# Patient Record
Sex: Male | Born: 1950
Health system: Southern US, Community
[De-identification: ages and names within clinical notes are randomized; demographics above are authoritative.]

## PROBLEM LIST (undated history)

## (undated) DIAGNOSIS — T7840XA Allergy, unspecified, initial encounter: Secondary | ICD-10-CM

## (undated) DIAGNOSIS — I10 Essential (primary) hypertension: Secondary | ICD-10-CM

## (undated) HISTORY — DX: Allergy, unspecified, initial encounter: T78.40XA

## (undated) HISTORY — DX: Essential (primary) hypertension: I10

---

## 1987-05-13 HISTORY — PX: CHOLECYSTECTOMY: SHX55

## 2019-05-17 ENCOUNTER — Encounter: Payer: Self-pay | Admitting: Family Medicine

## 2019-05-17 ENCOUNTER — Other Ambulatory Visit: Payer: Self-pay | Admitting: Family Medicine

## 2019-05-17 ENCOUNTER — Ambulatory Visit (INDEPENDENT_AMBULATORY_CARE_PROVIDER_SITE_OTHER): Payer: Medicare Other | Admitting: Family Medicine

## 2019-05-17 ENCOUNTER — Other Ambulatory Visit: Payer: Self-pay

## 2019-05-17 VITALS — BP 132/84 | HR 80 | Temp 98.3°F | Resp 18 | Ht 68.5 in | Wt 260.6 lb

## 2019-05-17 DIAGNOSIS — E669 Obesity, unspecified: Secondary | ICD-10-CM | POA: Diagnosis not present

## 2019-05-17 DIAGNOSIS — Z0000 Encounter for general adult medical examination without abnormal findings: Secondary | ICD-10-CM

## 2019-05-17 DIAGNOSIS — I10 Essential (primary) hypertension: Secondary | ICD-10-CM

## 2019-05-17 DIAGNOSIS — M19041 Primary osteoarthritis, right hand: Secondary | ICD-10-CM | POA: Diagnosis not present

## 2019-05-17 DIAGNOSIS — M19042 Primary osteoarthritis, left hand: Secondary | ICD-10-CM

## 2019-05-17 DIAGNOSIS — Z7689 Persons encountering health services in other specified circumstances: Secondary | ICD-10-CM | POA: Diagnosis not present

## 2019-05-17 DIAGNOSIS — R7309 Other abnormal glucose: Secondary | ICD-10-CM

## 2019-05-17 DIAGNOSIS — Z1159 Encounter for screening for other viral diseases: Secondary | ICD-10-CM

## 2019-05-17 DIAGNOSIS — R351 Nocturia: Secondary | ICD-10-CM

## 2019-05-17 MED ORDER — LOSARTAN POTASSIUM 100 MG PO TABS: 100.0000 mg | ORAL_TABLET | Freq: Every day | ORAL | 3 refills | Status: DC

## 2019-05-17 MED ORDER — AMLODIPINE BESYLATE 10 MG PO TABS: 10.0000 mg | ORAL_TABLET | Freq: Every day | ORAL | 3 refills | Status: DC

## 2019-05-17 NOTE — Assessment & Plan Note (Signed)
Encourage diet exercise weight loss 

## 2019-05-17 NOTE — Assessment & Plan Note (Signed)
Controlled - Home BP readings none  No known complications    Plan:  1. Continue current BP regimen - refilled Losartan 100mg  daily, Amlodipine 10mg  daily - local pharmacy he can DC previous provider rx 2. Encourage improved lifestyle - low sodium diet, regular exercise 3. May monitor BP outside office 4. Follow-up as planned several weeks for lab and physical

## 2019-05-17 NOTE — Progress Notes (Signed)
Subjective:    Patient ID: Cody Armstrong, male    DOB: [birth year], 68 y.o.   MRN: [id number]  Cody Armstrong is a 67 y.o. male presenting on 05/17/2019 for [place] and Hypertension  Relocated from [place] (near [place]), building house nearby here in [place].  HPI   General medical history He reports that he has been generally healthy - Back at Age 61 - he had an EKG as a screening. They referred him to a Cardiologist. They did an ECHOcardiogram - had a good report and normal result on ECHO. - No history of high cholesterol.  CHRONIC HTN: Reports no new concerns, continues on meds does well, needs transfer to local pharm Current Meds - Losartan 100mg  daily, Amlodipine 10mg  daily   Reports good compliance, took meds today. Tolerating well, w/o complaints. Lifestyle: - Diet: balanced, heavy on vegetables onions Denies CP, dyspnea, HA, edema, dizziness / lightheadedness  Osteoarthritis Multiple joints, hand/wrist/fingers Reports chronic history of repetitive overuse activity with hands. He has prior long history of working with hands and often tight grip, and says he has had wear and tear, previous dx arthritis, has used OTC treatments occasionally. Admits some nodules on hands, but no redness Admits stiffness and sore worse in AM  Chronic Allergic Rhinosinusitis Chronic problem. Seems worse recently Taking OTC Zyrtec generic. Never on nasal steroid. Denies fever or chills or purulence. Sinus pain.  Health Maintenance: UTD Pneumonia vaccines done age 89, 21. Last colonoscopy reported approx 6 years ago, around 2014. Request record.   Depression screen PHQ 2/9 05/17/2019  Decreased Interest 0  Down, Depressed, Hopeless 0  PHQ - 2 Score 0    Past Medical History:  Diagnosis Date  . Allergy   . Hypertension    Past Surgical History:  Procedure Laterality Date  . CHOLECYSTECTOMY  05/1987   Social History   Socioeconomic History  . Marital status: Married   Spouse name: Not on file  . Number of children: Not on file  . Years of education: [employer]  . Highest education level: High school graduate  Occupational History  . Not on file  Social Needs  . Financial resource strain: Not on file  . Food insecurity    Worry: Not on file    Inability: Not on file  . Transportation needs    Medical: Not on file    Non-medical: Not on file  Tobacco Use  . Smoking status: Former Smoker    Packs/day: 1.00    Years: 1.00    Pack years: 1.00  . Smokeless tobacco: Never Used  . Tobacco comment: only as a teen   Substance and Sexual Activity  . Alcohol use: Yes    Alcohol/week: 3.0 standard drinks    Types: 3 Standard drinks or equivalent per week  . Drug use: Never  . Sexual activity: Not on file  Lifestyle  . Physical activity    Days per week: Not on file    Minutes per session: Not on file  . Stress: Not on file  Relationships  . Social Herbalist on phone: Not on file    Gets together: Not on file    Attends religious service: Not on file    Active member of club or organization: Not on file    Attends meetings of clubs or organizations: Not on file    Relationship status: Not on file  . Intimate partner violence    Fear of current or ex partner:  Not on file    Emotionally abused: Not on file    Physically abused: Not on file    Forced sexual activity: Not on file  Other Topics Concern  . Not on file  Social History Narrative  . Not on file   Family History  Problem Relation Age of Onset  . Heart disease Mother   . Diabetes Mother   . Stroke Mother   . Stroke Father    Current Outpatient Medications on File Prior to Visit  Medication Sig  . fluticasone (FLONASE) 50 MCG/ACT nasal spray Place 2 sprays into both nostrils daily.  . naproxen (NAPROSYN) 250 MG tablet Take 250-500 mg by mouth 2 (two) times daily as needed.  . Omega-3 Fatty Acids (FISH OIL) 1000 MG CAPS Take by mouth.  . vitamin B-12 (CYANOCOBALAMIN)  100 MCG tablet Take 100 mcg by mouth daily.   No current facility-administered medications on file prior to visit.     Review of Systems Per HPI unless specifically indicated above     Objective:    BP 132/84 (BP Location: Left Arm, Patient Position: Sitting, Cuff Size: Normal)   Pulse 80   Temp 98.3 F (36.8 C) (Oral)   Resp 18   Ht 5' 8.5" (1.74 m)   Wt 260 lb 9.6 oz (118.2 kg)   SpO2 98%   BMI 39.05 kg/m   Wt Readings from Last 3 Encounters:  05/17/19 260 lb 9.6 oz (118.2 kg)    Physical Exam Vitals signs and nursing note reviewed.  Constitutional:      General: He is not in acute distress.    Appearance: He is well-developed. He is not diaphoretic.     Comments: Well-appearing, comfortable, cooperative  HENT:     Head: Normocephalic and atraumatic.  Eyes:     General:        Right eye: No discharge.        Left eye: No discharge.     Conjunctiva/sclera: Conjunctivae normal.  Cardiovascular:     Rate and Rhythm: Normal rate.  Pulmonary:     Effort: Pulmonary effort is normal.  Musculoskeletal:     Comments: Bilateral hands R>L with DIP nodules and some deformity angulation of distal joint. No erythema, no edema. Has full active ROM but slight stiffness. Some localized tender to R CMC joint. L hand similar but not as severe.  Skin:    General: Skin is warm and dry.     Findings: No erythema or rash.  Neurological:     Mental Status: He is alert and oriented to person, place, and time.  Psychiatric:        Behavior: Behavior normal.     Comments: Well groomed, good eye contact, normal speech and thoughts    No results found for this or any previous visit.    Assessment & Plan:   Problem List Items Addressed This Visit    Primary osteoarthritis of both hands    Clinically with bilateral R>L moderate advanced osteoarthritis with evidence on exam Secondary to chronic overuse wear and tear No injury No other complication, intact nerve / circulation  Plan  - Discussed progression of arthritis and treatments - Start more aggressive OTC regimen - Tylenol Ext Str 500 take 2 for 1000mg  TID PRN - Naproxen OTC 250mg  x 2 = 500mg  BID PRN - discussed avoid overuse NSAID limitations risk of side effect - Use topical OTC muscle rub or Voltaren if available - Compression, activity modification - Future  follow-up consider other meds, x-ray, referral possible CMC injection      Relevant Medications   naproxen (NAPROSYN) 250 MG tablet   Obesity (BMI 35.0-39.9 without comorbidity)    Encourage diet exercise weight loss      Essential hypertension - Primary    Controlled - Home BP readings none  No known complications    Plan:  1. Continue current BP regimen - refilled Losartan 100mg  daily, Amlodipine 10mg  daily - local pharmacy he can DC previous provider rx 2. Encourage improved lifestyle - low sodium diet, regular exercise 3. May monitor BP outside office 4. Follow-up as planned several weeks for lab and physical       Relevant Medications   amLODipine (NORVASC) 10 MG tablet   losartan (COZAAR) 100 MG tablet    Other Visit Diagnoses    Encounter to establish care with new doctor        Request copy of prior PCP outside records including test colonoscopy   Meds ordered this encounter  Medications  . amLODipine (NORVASC) 10 MG tablet    Sig: Take 1 tablet (10 mg total) by mouth daily.    Dispense:  90 tablet    Refill:  3  . losartan (COZAAR) 100 MG tablet    Sig: Take 1 tablet (100 mg total) by mouth daily.    Dispense:  90 tablet    Refill:  3    Follow up plan: Return in about 6 weeks (around 06/28/2019) for Annual Physical.  Future labs ordered for 06/21/19  Saralyn PilarAlexander Senaya Dicenso, DO [facility] 05/17/2019, 10:45 AM

## 2019-05-17 NOTE — Patient Instructions (Addendum)
Thank you for coming to the office today.  Get the OTC -Flonase (generic Fluticasone) - Start nasal steroid Flonase 2 sprays in each nostril daily for 4-6 weeks, may repeat course seasonally or as needed - can continue for few   Continue the OTC allergy pill  In future we can add a Singulair if needed.  Recommend trial of Anti-inflammatory with Naproxen 250mg  tabs - take one to two with food and plenty of water TWICE daily every day (breakfast and dinner), for next 2 to 4 weeks, then you may take only as needed - DO NOT TAKE any ibuprofen, motrin while you are taking this medicine  Recommend to start taking Tylenol Extra Strength 500mg  tabs - take 1 to 2 tabs per dose (max 1000mg ) every 6-8 hours for pain (take regularly, don't skip a dose for next 7 days), max 24 hour daily dose is 6 tablets or 3000mg . In the future you can repeat the same everyday Tylenol course for 1-2 weeks at a time.  - This is safe to take with anti-inflammatory medicines (Ibuprofen, Advil, Naproxen, Aleve, Meloxicam, Mobic)  May use Voltaren topical if needed.  Refilled the BP medications to local [facility], CANCEL the previous doctor rx.  DUE for FASTING BLOOD WORK (no food or drink after midnight before the lab appointment, only water or coffee without cream/sugar on the morning of)  SCHEDULE "Lab Only" visit in the morning at the clinic for lab draw in  4-6 weeks  - Make sure Lab Only appointment is at about 1 week before your next appointment, so that results will be available  For Lab Results, once available within 2-3 days of blood draw, you can can log in to MyChart online to view your results and a brief explanation. Also, we can discuss results at next follow-up visit.   Please schedule a Follow-up Appointment to: Return in about 6 weeks (around 06/28/2019) for Annual Physical.  If you have any other questions or concerns, please feel free to call the office or send a message through [facility]. You may also  schedule an earlier appointment if necessary.  Additionally, you may be receiving a survey about your experience at our office within a few days to 1 week by e-mail or mail. We value your feedback.  Nobie Putnam, DO [place]

## 2019-05-17 NOTE — Assessment & Plan Note (Signed)
Clinically with bilateral R>L moderate advanced osteoarthritis with evidence on exam Secondary to chronic overuse wear and tear No injury No other complication, intact nerve / circulation  Plan - Discussed progression of arthritis and treatments - Start more aggressive OTC regimen - Tylenol Ext Str 500 take 2 for 1000mg  TID PRN - Naproxen OTC 250mg  x 2 = 500mg  BID PRN - discussed avoid overuse NSAID limitations risk of side effect - Use topical OTC muscle rub or Voltaren if available - Compression, activity modification - Future follow-up consider other meds, x-ray, referral possible CMC injection

## 2019-06-21 ENCOUNTER — Other Ambulatory Visit: Payer: Medicare Other

## 2019-06-21 ENCOUNTER — Other Ambulatory Visit: Payer: Self-pay

## 2019-06-21 DIAGNOSIS — I10 Essential (primary) hypertension: Secondary | ICD-10-CM

## 2019-06-21 DIAGNOSIS — Z0000 Encounter for general adult medical examination without abnormal findings: Secondary | ICD-10-CM

## 2019-06-21 DIAGNOSIS — Z1159 Encounter for screening for other viral diseases: Secondary | ICD-10-CM

## 2019-06-21 DIAGNOSIS — R351 Nocturia: Secondary | ICD-10-CM

## 2019-06-21 DIAGNOSIS — R7309 Other abnormal glucose: Secondary | ICD-10-CM | POA: Diagnosis not present

## 2019-06-22 LAB — CBC WITH DIFFERENTIAL/PLATELET
Absolute Monocytes: 531 cells/uL (ref 200–950)
Basophils Absolute: 67 cells/uL (ref 0–200)
Basophils Relative: 1.1 %
Eosinophils Absolute: 311 cells/uL (ref 15–500)
Eosinophils Relative: 5.1 %
HCT: 48.4 % (ref 38.5–50.0)
Hemoglobin: 16.4 g/dL (ref 13.2–17.1)
Lymphs Abs: 2050 cells/uL (ref 850–3900)
MCH: 29.6 pg (ref 27.0–33.0)
MCHC: 33.9 g/dL (ref 32.0–36.0)
MCV: 87.4 fL (ref 80.0–100.0)
MPV: 9.8 fL (ref 7.5–12.5)
Monocytes Relative: 8.7 %
Neutro Abs: 3142 cells/uL (ref 1500–7800)
Neutrophils Relative %: 51.5 %
Platelets: 248 10*3/uL (ref 140–400)
RBC: 5.54 10*6/uL (ref 4.20–5.80)
RDW: 13 % (ref 11.0–15.0)
Total Lymphocyte: 33.6 %
WBC: 6.1 10*3/uL (ref 3.8–10.8)

## 2019-06-22 LAB — TSH: TSH: 1.74 mIU/L (ref 0.40–4.50)

## 2019-06-22 LAB — HEPATITIS C ANTIBODY
Hepatitis C Ab: NONREACTIVE
SIGNAL TO CUT-OFF: 0.01 (ref <1.00)

## 2019-06-22 LAB — COMPLETE METABOLIC PANEL WITH GFR
AG Ratio: 2.4 (calc) (ref 1.0–2.5)
ALT: 64 U/L — ABNORMAL HIGH (ref 9–46)
AST: 31 U/L (ref 10–35)
Albumin: 4.6 g/dL (ref 3.6–5.1)
Alkaline phosphatase (APISO): 65 U/L (ref 35–144)
BUN: 19 mg/dL (ref 7–25)
CO2: 24 mmol/L (ref 20–32)
Calcium: 9.6 mg/dL (ref 8.6–10.3)
Chloride: 106 mmol/L (ref 98–110)
Creat: 0.95 mg/dL (ref 0.70–1.25)
GFR, Est African American: 95 mL/min/{1.73_m2} (ref >=60)
GFR, Est Non African American: 82 mL/min/{1.73_m2} (ref >=60)
Globulin: 1.9 g/dL (calc) (ref 1.9–3.7)
Glucose, Bld: 115 mg/dL — ABNORMAL HIGH (ref 65–99)
Potassium: 4.5 mmol/L (ref 3.5–5.3)
Sodium: 140 mmol/L (ref 135–146)
Total Bilirubin: 0.4 mg/dL (ref 0.2–1.2)
Total Protein: 6.5 g/dL (ref 6.1–8.1)

## 2019-06-22 LAB — HEMOGLOBIN A1C
Hgb A1c MFr Bld: 5.7 % of total Hgb — ABNORMAL HIGH (ref <5.7)
Mean Plasma Glucose: 117 (calc)
eAG (mmol/L): 6.5 (calc)

## 2019-06-22 LAB — LIPID PANEL
Cholesterol: 196 mg/dL (ref <200)
HDL: 46 mg/dL (ref >=40)
LDL Cholesterol (Calc): 111 mg/dL (calc) — ABNORMAL HIGH
Non-HDL Cholesterol (Calc): 150 mg/dL (calc) — ABNORMAL HIGH (ref <130)
Total CHOL/HDL Ratio: 4.3 (calc) (ref <5.0)
Triglycerides: 295 mg/dL — ABNORMAL HIGH (ref <150)

## 2019-06-22 LAB — PSA: PSA: 0.7 ng/mL (ref <=4.0)

## 2019-06-27 ENCOUNTER — Other Ambulatory Visit: Payer: Self-pay | Admitting: Family Medicine

## 2019-06-27 ENCOUNTER — Encounter: Payer: Self-pay | Admitting: Family Medicine

## 2019-06-27 ENCOUNTER — Ambulatory Visit (INDEPENDENT_AMBULATORY_CARE_PROVIDER_SITE_OTHER): Payer: Medicare Other | Admitting: Family Medicine

## 2019-06-27 ENCOUNTER — Other Ambulatory Visit: Payer: Self-pay

## 2019-06-27 VITALS — BP 126/64 | HR 85 | Temp 98.0°F | Resp 18 | Ht 68.5 in | Wt 262.0 lb

## 2019-06-27 DIAGNOSIS — R7309 Other abnormal glucose: Secondary | ICD-10-CM

## 2019-06-27 DIAGNOSIS — I10 Essential (primary) hypertension: Secondary | ICD-10-CM | POA: Diagnosis not present

## 2019-06-27 DIAGNOSIS — E782 Mixed hyperlipidemia: Secondary | ICD-10-CM | POA: Diagnosis not present

## 2019-06-27 DIAGNOSIS — Z0000 Encounter for general adult medical examination without abnormal findings: Secondary | ICD-10-CM

## 2019-06-27 DIAGNOSIS — E669 Obesity, unspecified: Secondary | ICD-10-CM | POA: Diagnosis not present

## 2019-06-27 DIAGNOSIS — Z125 Encounter for screening for malignant neoplasm of prostate: Secondary | ICD-10-CM

## 2019-06-27 DIAGNOSIS — R351 Nocturia: Secondary | ICD-10-CM

## 2019-06-27 NOTE — Assessment & Plan Note (Signed)
Mostly controlled cholesterol LDL HDL on lifestyle, has elevated TG now w/ higher sugar Last lipid panel 06/2019 Calculated ASCVD 10 yr risk score 20%  Plan: 1. Offered ASCVD risk reduction primary with Statin therapy - he declined, despite counseling benefit risk 2. Encourage improved lifestyle - low carb/cholesterol, reduce portion size, continue improving regular exercise Yearly lipid

## 2019-06-27 NOTE — Progress Notes (Signed)
Subjective:    Patient ID: Cody Armstrong, male    DOB: 07-Sep-1950, 68 y.o.   MRN: [id number]  Cody Armstrong is a 68 y.o. male presenting on 06/27/2019 for Annual Exam   HPI   Here for Annual Physical and Lab Review.  CHRONIC HTN: Reports no new concerns, continues on meds does well Current Meds - Losartan 100mg  daily, Amlodipine 10mg  daily   Reports good compliance, took meds today. Tolerating well, w/o complaints. Lifestyle: - Diet: balanced, heavy on vegetables onions Denies CP, dyspnea, HA, edema, dizziness / lightheadedness  Osteoarthritis Multiple joints, hand/wrist/fingers Reports chronic history of repetitive overuse activity with hands. He has prior long history of working with hands and often tight grip, and says he has had wear and tear, previous dx arthritis, has used OTC treatments occasionally.  He takes Tylenol / Naproxen PRN with some good results Admits some nodules on hands, but no redness Admits stiffness and sore worse in AM  HYPERLIPIDEMIA: - Reports no concerns. Last lipid panel 06/2019, controlled LDL HDL except high TG Not on statin therapy  Elevated ALT Prior history elevated ALT isolated, chart review results for past 5 years, similar readings. No new concerns.   Health Maintenance: UTD Pneumonia vaccines done age 8, 89. Last colonoscopy reported approx 6 years ago, around 2014. Request record. Again - offered cologuard.  Prostate CA Screening: Prior PSA / DRE reported normal. Last PSA 0.7 (06/2019). Currently asymptomatic. No known family history of prostate CA.    Depression screen PHQ 2/9 05/17/2019  Decreased Interest 0  Down, Depressed, Hopeless 0  PHQ - 2 Score 0    Past Medical History:  Diagnosis Date  . Allergy   . Hypertension    Past Surgical History:  Procedure Laterality Date  . CHOLECYSTECTOMY  05/1987   Social History   Socioeconomic History  . Marital status: Married    Spouse name: Not on file  . Number of  children: Not on file  . Years of education: [employer]  . Highest education level: High school graduate  Occupational History  . Not on file  Tobacco Use  . Smoking status: Former Smoker    Packs/day: 1.00    Years: 1.00    Pack years: 1.00  . Smokeless tobacco: Never Used  . Tobacco comment: only as a teen   Substance and Sexual Activity  . Alcohol use: Yes    Alcohol/week: 3.0 standard drinks    Types: 3 Standard drinks or equivalent per week  . Drug use: Never  . Sexual activity: Not on file  Other Topics Concern  . Not on file  Social History Narrative  . Not on file   Social Determinants of Health   Financial Resource Strain:   . Difficulty of Paying Living Expenses: Not on file  Food Insecurity:   . Worried About Charity fundraiser in the Last Year: Not on file  . Ran Out of Food in the Last Year: Not on file  Transportation Needs:   . Lack of Transportation (Medical): Not on file  . Lack of Transportation (Non-Medical): Not on file  Physical Activity:   . Days of Exercise per Week: Not on file  . Minutes of Exercise per Session: Not on file  Stress:   . Feeling of Stress : Not on file  Social Connections:   . Frequency of Communication with Friends and Family: Not on file  . Frequency of Social Gatherings with Friends and Family: Not on file  .  Attends Religious Services: Not on file  . Active Member of Clubs or Organizations: Not on file  . Attends BankerClub or Organization Meetings: Not on file  . Marital Status: Not on file  Intimate Partner Violence:   . Fear of Current or Ex-Partner: Not on file  . Emotionally Abused: Not on file  . Physically Abused: Not on file  . Sexually Abused: Not on file   Family History  Problem Relation Age of Onset  . Heart disease Mother   . Diabetes Mother   . Stroke Mother   . Stroke Father    Current Outpatient Medications on File Prior to Visit  Medication Sig  . amLODipine (NORVASC) 10 MG tablet Take 1 tablet (10  mg total) by mouth daily.  . fluticasone (FLONASE) 50 MCG/ACT nasal spray Place 2 sprays into both nostrils daily.  Marland Kitchen. losartan (COZAAR) 100 MG tablet Take 1 tablet (100 mg total) by mouth daily.  . naproxen (NAPROSYN) 250 MG tablet Take 250-500 mg by mouth 2 (two) times daily as needed.  . Omega-3 Fatty Acids (FISH OIL) 1000 MG CAPS Take by mouth.  . vitamin B-12 (CYANOCOBALAMIN) 100 MCG tablet Take 100 mcg by mouth daily.   No current facility-administered medications on file prior to visit.    Review of Systems  Constitutional: Negative for activity change, appetite change, chills, diaphoresis, fatigue and fever.  HENT: Negative for congestion and hearing loss.   Eyes: Negative for visual disturbance.  Respiratory: Negative for apnea, cough, chest tightness, shortness of breath and wheezing.   Cardiovascular: Negative for chest pain, palpitations and leg swelling.  Gastrointestinal: Negative for abdominal pain, anal bleeding, blood in stool, constipation, diarrhea, nausea and vomiting.  Endocrine: Negative for cold intolerance.  Genitourinary: Negative for difficulty urinating, dysuria, frequency and hematuria.  Musculoskeletal: Negative for arthralgias, back pain and neck pain.  Skin: Negative for rash.  Allergic/Immunologic: Negative for environmental allergies.  Neurological: Negative for dizziness, weakness, light-headedness, numbness and headaches.  Hematological: Negative for adenopathy.  Psychiatric/Behavioral: Negative for behavioral problems, dysphoric mood and sleep disturbance. The patient is not nervous/anxious.    Per HPI unless specifically indicated above      Objective:    BP 126/64 (BP Location: Left Arm, Cuff Size: Normal)   Pulse 85   Temp 98 F (36.7 C) (Oral)   Resp 18   Ht 5' 8.5" (1.74 m)   Wt 262 lb (118.8 kg)   BMI 39.26 kg/m   Wt Readings from Last 3 Encounters:  06/27/19 262 lb (118.8 kg)  05/17/19 260 lb 9.6 oz (118.2 kg)    Physical  Exam Vitals and nursing note reviewed.  Constitutional:      General: He is not in acute distress.    Appearance: He is well-developed. He is not diaphoretic.     Comments: Well-appearing, comfortable, cooperative  HENT:     Head: Normocephalic and atraumatic.  Eyes:     General:        Right eye: No discharge.        Left eye: No discharge.     Conjunctiva/sclera: Conjunctivae normal.     Pupils: Pupils are equal, round, and reactive to light.  Neck:     Thyroid: No thyromegaly.     Comments: No carotid bruits Cardiovascular:     Rate and Rhythm: Normal rate and regular rhythm.     Heart sounds: Normal heart sounds. No murmur.  Pulmonary:     Effort: Pulmonary effort is normal. No  respiratory distress.     Breath sounds: Normal breath sounds. No wheezing or rales.  Abdominal:     General: Bowel sounds are normal. There is no distension.     Palpations: Abdomen is soft. There is no mass.     Tenderness: There is no abdominal tenderness.  Musculoskeletal:        General: No tenderness. Normal range of motion.     Cervical back: Normal range of motion and neck supple.     Comments: Upper / Lower Extremities: - Normal muscle tone, strength bilateral upper extremities 5/5, lower extremities 5/5  Lymphadenopathy:     Cervical: No cervical adenopathy.  Skin:    General: Skin is warm and dry.     Findings: No erythema or rash.  Neurological:     Mental Status: He is alert and oriented to person, place, and time.     Comments: Distal sensation intact to light touch all extremities  Psychiatric:        Behavior: Behavior normal.     Comments: Well groomed, good eye contact, normal speech and thoughts         Results for orders placed or performed in visit on 06/21/19  [facility] - TSH  Result Value Ref Range   TSH 1.74 0.40 - 4.50 mIU/L  [facility] - Hepatitis C antibody, Hep C Screen w/ reflex RNA quant  Result Value Ref Range   Hepatitis C Ab NON-REACTIVE NON-REACTI   SIGNAL TO  CUT-OFF 0.01 <1.00  [facility] - PSA physical  Result Value Ref Range   PSA 0.7 < OR = 4.0 ng/mL  [facility] - Lipid panel physical  Result Value Ref Range   Cholesterol 196 <200 mg/dL   HDL 46 > OR = 40 mg/dL   Triglycerides 725 (H) <150 mg/dL   LDL Cholesterol (Calc) 111 (H) mg/dL (calc)   Total CHOL/HDL Ratio 4.3 <5.0 (calc)   Non-HDL Cholesterol (Calc) 150 (H) <130 mg/dL (calc)  [facility] - CMET w/ GFR CMP Complete Metabolic Panel physical  Result Value Ref Range   Glucose, Bld 115 (H) 65 - 99 mg/dL   BUN 19 7 - 25 mg/dL   Creat 3.66 4.40 - 3.47 mg/dL   GFR, Est Non African American 82 > OR = 60 mL/min/1.41m2   GFR, Est African American 95 > OR = 60 mL/min/1.52m2   BUN/Creatinine Ratio NOT APPLICABLE 6 - 22 (calc)   Sodium 140 135 - 146 mmol/L   Potassium 4.5 3.5 - 5.3 mmol/L   Chloride 106 98 - 110 mmol/L   CO2 24 20 - 32 mmol/L   Calcium 9.6 8.6 - 10.3 mg/dL   Total Protein 6.5 6.1 - 8.1 g/dL   Albumin 4.6 3.6 - 5.1 g/dL   Globulin 1.9 1.9 - 3.7 g/dL (calc)   AG Ratio 2.4 1.0 - 2.5 (calc)   Total Bilirubin 0.4 0.2 - 1.2 mg/dL   Alkaline phosphatase (APISO) 65 35 - 144 U/L   AST 31 10 - 35 U/L   ALT 64 (H) 9 - 46 U/L  [facility] - CBC with Differential/Platelet physical  Result Value Ref Range   WBC 6.1 3.8 - 10.8 Thousand/uL   RBC 5.54 4.20 - 5.80 Million/uL   Hemoglobin 16.4 13.2 - 17.1 g/dL   HCT 42.5 95.6 - 38.7 %   MCV 87.4 80.0 - 100.0 fL   MCH 29.6 27.0 - 33.0 pg   MCHC 33.9 32.0 - 36.0 g/dL   RDW 56.4 33.2 - 95.1 %  Platelets 248 140 - 400 Thousand/uL   MPV 9.8 7.5 - 12.5 fL   Neutro Abs 3,142 1,500 - 7,800 cells/uL   Lymphs Abs 2,050 850 - 3,900 cells/uL   Absolute Monocytes 531 200 - 950 cells/uL   Eosinophils Absolute 311 15 - 500 cells/uL   Basophils Absolute 67 0 - 200 cells/uL   Neutrophils Relative % 51.5 %   Total Lymphocyte 33.6 %   Monocytes Relative 8.7 %   Eosinophils Relative 5.1 %   Basophils Relative 1.1 %  [facility] - A1c LAB Hemoglobin A1C physical  Result  Value Ref Range   Hgb A1c MFr Bld 5.7 (H) <5.7 % of total Hgb   Mean Plasma Glucose 117 (calc)   eAG (mmol/L) 6.5 (calc)      Assessment & Plan:   Problem List Items Addressed This Visit    Obesity (BMI 35.0-39.9 without comorbidity)    Encourage diet exercise weight loss      Mixed hyperlipidemia    Mostly controlled cholesterol LDL HDL on lifestyle, has elevated TG now w/ higher sugar Last lipid panel 06/2019 Calculated ASCVD 10 yr risk score 20%  Plan: 1. Offered ASCVD risk reduction primary with Statin therapy - he declined, despite counseling benefit risk 2. Encourage improved lifestyle - low carb/cholesterol, reduce portion size, continue improving regular exercise Yearly lipid      Essential hypertension    Controlled - Home BP readings none  No known complications    Plan:  1. Continue current BP regimen - Losartan 100mg  daily, Amlodipine 10mg  daily 2. Encourage improved lifestyle - low sodium diet, regular exercise 3. May monitor BP outside office      Elevated hemoglobin A1c    Mild elevated A1c to 5.7, at range of early PreDM now Concern with obesity, HTN, HLD  Plan:  1. Not on any therapy currently  2. Encourage improved lifestyle - low carb, low sugar diet, reduce portion size, continue improving regular exercise - diet handout given 3. Follow-up 6 to 12 mo repeat A1c        Other Visit Diagnoses    Annual physical exam    -  Primary     Updated Health Maintenance information - Offered Cologuard, will request prior record, still didn't receive it for colonoscopy, should be due in 2022 or 2024, he will consider cologuard. Reviewed recent lab results with patient Encouraged improvement to lifestyle with diet and exercise - Goal of weight loss   No orders of the defined types were placed in this encounter.   Follow up plan: Return in about 1 year (around 06/26/2020) for Annual Physical.  2025, DO [facility] 06/27/2019, 10:25 AM

## 2019-06-27 NOTE — Assessment & Plan Note (Signed)
Mild elevated A1c to 5.7, at range of early PreDM now Concern with obesity, HTN, HLD  Plan:  1. Not on any therapy currently  2. Encourage improved lifestyle - low carb, low sugar diet, reduce portion size, continue improving regular exercise - diet handout given 3. Follow-up 6 to 12 mo repeat A1c

## 2019-06-27 NOTE — Assessment & Plan Note (Signed)
Controlled - Home BP readings none  No known complications    Plan:  1. Continue current BP regimen - Losartan 100mg  daily, Amlodipine 10mg  daily 2. Encourage improved lifestyle - low sodium diet, regular exercise 3. May monitor BP outside office

## 2019-06-27 NOTE — Assessment & Plan Note (Signed)
Encourage diet exercise weight loss 

## 2019-06-27 NOTE — Patient Instructions (Addendum)
Thank you for coming to the office today.  Keep up the good work.  Reduce carbs / starches bread pasta to help A1c sugar, and Triglyerides.   DUE for FASTING BLOOD WORK (no food or drink after midnight before the lab appointment, only water or coffee without cream/sugar on the morning of)  SCHEDULE "Lab Only" visit in the morning at the clinic for lab draw in 1 YEAR  - Make sure Lab Only appointment is at about 1 week before your next appointment, so that results will be available  For Lab Results, once available within 2-3 days of blood draw, you can can log in to MyChart online to view your results and a brief explanation. Also, we can discuss results at next follow-up visit.   Please schedule a Follow-up Appointment to: Return in about 1 year (around 06/26/2020) for Annual Physical.  If you have any other questions or concerns, please feel free to call the office or send a message through [place]. You may also schedule an earlier appointment if necessary.  Additionally, you may be receiving a survey about your experience at our office within a few days to 1 week by e-mail or mail. We value your feedback.  Nobie Putnam, DO [place]

## 2019-06-28 ENCOUNTER — Encounter: Payer: Medicare Other | Admitting: Family Medicine

## 2020-02-05 ENCOUNTER — Encounter: Payer: Self-pay | Admitting: Family Medicine

## 2020-06-20 ENCOUNTER — Other Ambulatory Visit: Payer: Self-pay | Admitting: *Deleted

## 2020-06-20 DIAGNOSIS — Z0000 Encounter for general adult medical examination without abnormal findings: Secondary | ICD-10-CM

## 2020-06-20 DIAGNOSIS — R351 Nocturia: Secondary | ICD-10-CM

## 2020-06-20 DIAGNOSIS — E669 Obesity, unspecified: Secondary | ICD-10-CM

## 2020-06-20 DIAGNOSIS — R7309 Other abnormal glucose: Secondary | ICD-10-CM

## 2020-06-20 DIAGNOSIS — E782 Mixed hyperlipidemia: Secondary | ICD-10-CM

## 2020-06-20 DIAGNOSIS — I10 Essential (primary) hypertension: Secondary | ICD-10-CM

## 2020-06-20 DIAGNOSIS — Z125 Encounter for screening for malignant neoplasm of prostate: Secondary | ICD-10-CM

## 2020-06-23 ENCOUNTER — Other Ambulatory Visit: Payer: Medicare Other

## 2020-06-23 DIAGNOSIS — E782 Mixed hyperlipidemia: Secondary | ICD-10-CM | POA: Diagnosis not present

## 2020-06-23 DIAGNOSIS — R7309 Other abnormal glucose: Secondary | ICD-10-CM | POA: Diagnosis not present

## 2020-06-23 DIAGNOSIS — I10 Essential (primary) hypertension: Secondary | ICD-10-CM | POA: Diagnosis not present

## 2020-06-24 LAB — CBC WITH DIFFERENTIAL/PLATELET
Absolute Monocytes: 506 cells/uL (ref 200–950)
Basophils Absolute: 58 cells/uL (ref 0–200)
Basophils Relative: 0.9 %
Eosinophils Absolute: 352 cells/uL (ref 15–500)
Eosinophils Relative: 5.5 %
HCT: 49.1 % (ref 38.5–50.0)
Hemoglobin: 16.8 g/dL (ref 13.2–17.1)
Lymphs Abs: 1933 cells/uL (ref 850–3900)
MCH: 29.7 pg (ref 27.0–33.0)
MCHC: 34.2 g/dL (ref 32.0–36.0)
MCV: 86.9 fL (ref 80.0–100.0)
MPV: 9.8 fL (ref 7.5–12.5)
Monocytes Relative: 7.9 %
Neutro Abs: 3552 cells/uL (ref 1500–7800)
Neutrophils Relative %: 55.5 %
Platelets: 262 10*3/uL (ref 140–400)
RBC: 5.65 10*6/uL (ref 4.20–5.80)
RDW: 12.9 % (ref 11.0–15.0)
Total Lymphocyte: 30.2 %
WBC: 6.4 10*3/uL (ref 3.8–10.8)

## 2020-06-24 LAB — COMPLETE METABOLIC PANEL WITH GFR
AG Ratio: 1.8 (calc) (ref 1.0–2.5)
ALT: 77 U/L — ABNORMAL HIGH (ref 9–46)
AST: 33 U/L (ref 10–35)
Albumin: 4.6 g/dL (ref 3.6–5.1)
Alkaline phosphatase (APISO): 67 U/L (ref 35–144)
BUN: 18 mg/dL (ref 7–25)
CO2: 26 mmol/L (ref 20–32)
Calcium: 10 mg/dL (ref 8.6–10.3)
Chloride: 105 mmol/L (ref 98–110)
Creat: 0.93 mg/dL (ref 0.70–1.25)
GFR, Est African American: 97 mL/min/{1.73_m2} (ref >=60)
GFR, Est Non African American: 83 mL/min/{1.73_m2} (ref >=60)
Globulin: 2.5 g/dL (calc) (ref 1.9–3.7)
Glucose, Bld: 114 mg/dL — ABNORMAL HIGH (ref 65–99)
Potassium: 4.6 mmol/L (ref 3.5–5.3)
Sodium: 141 mmol/L (ref 135–146)
Total Bilirubin: 0.6 mg/dL (ref 0.2–1.2)
Total Protein: 7.1 g/dL (ref 6.1–8.1)

## 2020-06-24 LAB — LIPID PANEL
Cholesterol: 214 mg/dL — ABNORMAL HIGH (ref <200)
HDL: 49 mg/dL (ref >=40)
LDL Cholesterol (Calc): 125 mg/dL (calc) — ABNORMAL HIGH
Non-HDL Cholesterol (Calc): 165 mg/dL (calc) — ABNORMAL HIGH (ref <130)
Total CHOL/HDL Ratio: 4.4 (calc) (ref <5.0)
Triglycerides: 259 mg/dL — ABNORMAL HIGH (ref <150)

## 2020-06-24 LAB — HEMOGLOBIN A1C
Hgb A1c MFr Bld: 5.7 % of total Hgb — ABNORMAL HIGH (ref <5.7)
Mean Plasma Glucose: 117 mg/dL
eAG (mmol/L): 6.5 mmol/L

## 2020-06-24 LAB — PSA: PSA: 1.41 ng/mL (ref <=4.0)

## 2020-06-30 ENCOUNTER — Encounter: Payer: Self-pay | Admitting: Family Medicine

## 2020-06-30 ENCOUNTER — Other Ambulatory Visit: Payer: Self-pay | Admitting: Family Medicine

## 2020-06-30 ENCOUNTER — Other Ambulatory Visit: Payer: Self-pay

## 2020-06-30 ENCOUNTER — Ambulatory Visit (INDEPENDENT_AMBULATORY_CARE_PROVIDER_SITE_OTHER): Payer: Medicare Other | Admitting: Family Medicine

## 2020-06-30 VITALS — BP 134/79 | HR 80 | Temp 97.5°F | Resp 16 | Ht 68.0 in | Wt 257.6 lb

## 2020-06-30 DIAGNOSIS — I10 Essential (primary) hypertension: Secondary | ICD-10-CM | POA: Diagnosis not present

## 2020-06-30 DIAGNOSIS — R7309 Other abnormal glucose: Secondary | ICD-10-CM

## 2020-06-30 DIAGNOSIS — E782 Mixed hyperlipidemia: Secondary | ICD-10-CM

## 2020-06-30 DIAGNOSIS — M19041 Primary osteoarthritis, right hand: Secondary | ICD-10-CM | POA: Diagnosis not present

## 2020-06-30 DIAGNOSIS — E669 Obesity, unspecified: Secondary | ICD-10-CM | POA: Diagnosis not present

## 2020-06-30 DIAGNOSIS — Z0000 Encounter for general adult medical examination without abnormal findings: Secondary | ICD-10-CM

## 2020-06-30 DIAGNOSIS — M19042 Primary osteoarthritis, left hand: Secondary | ICD-10-CM

## 2020-06-30 DIAGNOSIS — R351 Nocturia: Secondary | ICD-10-CM

## 2020-06-30 MED ORDER — LOSARTAN POTASSIUM 100 MG PO TABS: 100.0000 mg | ORAL_TABLET | Freq: Every day | ORAL | 3 refills | Status: DC

## 2020-06-30 MED ORDER — GABAPENTIN 100 MG PO CAPS: ORAL_CAPSULE | ORAL | 3 refills | Status: DC

## 2020-06-30 MED ORDER — AMLODIPINE BESYLATE 10 MG PO TABS: 10.0000 mg | ORAL_TABLET | Freq: Every day | ORAL | 3 refills | Status: DC

## 2020-06-30 NOTE — Assessment & Plan Note (Signed)
Mild elevated A1c to 5.7, at range of early PreDM now Concern with obesity, HTN, HLD  Plan:  1. Not on any therapy currently  2. Encourage improved lifestyle - low carb, low sugar diet, reduce portion size, continue improving regular exercise - diet handout given Yearly check

## 2020-06-30 NOTE — Assessment & Plan Note (Signed)
Clinically with bilateral R>L moderate advanced osteoarthritis with evidence on exam Secondary to chronic overuse wear and tear No injury No other complication, intact nerve / circulation  Plan - Discussed progression of arthritis and treatments - Keep on regimen - Tylenol Ext Str 500 take 2 for 1000mg  TID PRN - He may use Ibuprofen PO PRN as discussed - Re-Try Voltaren topical OTC - START Gabapentin 100mg  titration as advised - Compression, activity modification - Future follow-up consider other meds, x-ray, referral possible [facility] injection

## 2020-06-30 NOTE — Progress Notes (Signed)
Subjective:    Patient ID: Cody Armstrong, male    DOB: March 29, 1951, 68 y.o.   MRN: [id number]  Cody Armstrong is a 69 y.o. male presenting on 06/30/2020 for Annual Exam   HPI  Here for Annual Physical and Lab Review.  CHRONIC HTN: Reportsno new concerns, continues on meds does well Current Meds -Losartan 100mg  daily, Amlodipine 10mg  daily Reports good compliance, took meds today. Tolerating well, w/o complaints. Lifestyle: - Diet:balanced, heavy on vegetables onions Denies CP, dyspnea, HA, edema, dizziness / lightheadedness  Osteoarthritis Multiple joints, hand/wrist/fingers Reports chronic history of repetitive overuse activity with hands. Cody Armstrong has prior long history of working with hands and often tight grip, and says Cody Armstrong has had wear and tear, previous dx arthritis, has used OTC treatments occasionally.  Cody Armstrong takes Tylenol / Ibuprofen PRN with some good results Admits some nodules on hands, but no redness Admits stiffness and sore worse in AM Worse with winter weather. Plays golf weekly Has not tried Gabapentin.  HYPERLIPIDEMIA: - Reports no concerns. Last lipid panel 06/2020, controlled LDL HDL except high TG Not on statin therapy. Cody Armstrong declines Cody Armstrong tries to avoid some meats, beef/pork Prior history of Cardiology evaluation and had EKG / stress test, ruled out cardiac blockages.  Elevated ALT Prior history elevated ALT isolated   Health Maintenance: UTD Pneumonia vaccines done age 23, 64. Last colonoscopy reported approx 6 years ago, around 2014. Request record. Again - offered cologuard.  Prostate CA Screening: Prior PSA / DRE reported normal. Last PSA 0.7 (06/2019). Currently asymptomatic. No known family history of prostate CA.  Depression screen [facility] 2/9 06/30/2020 05/17/2019  Decreased Interest 0 0  Down, Depressed, Hopeless 0 0  PHQ - 2 Score 0 0    Past Medical History:  Diagnosis Date  . Allergy   . Hypertension    Past Surgical History:   Procedure Laterality Date  . CHOLECYSTECTOMY  05/1987   Social History   Socioeconomic History  . Marital status: Married    Spouse name: Not on file  . Number of children: Not on file  . Years of education: 13/11/2018  . Highest education level: High school graduate  Occupational History  . Not on file  Tobacco Use  . Smoking status: Former Smoker    Packs/day: 1.00    Years: 1.00    Pack years: 1.00  . Smokeless tobacco: Never Used  . Tobacco comment: only as a teen   Vaping Use  . Vaping Use: Never used  Substance and Sexual Activity  . Alcohol use: Yes    Alcohol/week: 3.0 standard drinks    Types: 3 Standard drinks or equivalent per week  . Drug use: Never  . Sexual activity: Not on file  Other Topics Concern  . Not on file  Social History Narrative  . Not on file   Social Determinants of Health   Financial Resource Strain: Not on file  Food Insecurity: Not on file  Transportation Needs: Not on file  Physical Activity: Not on file  Stress: Not on file  Social Connections: Not on file  Intimate Partner Violence: Not on file   Family History  Problem Relation Age of Onset  . Heart disease Mother   . Diabetes Mother   . Stroke Mother   . Stroke Father    Current Outpatient Medications on File Prior to Visit  Medication Sig  . Omega-3 Fatty Acids (FISH OIL) 1000 MG CAPS Take by mouth. (Patient not taking: Reported on 06/30/2020)  .  vitamin B-12 (CYANOCOBALAMIN) 100 MCG tablet Take 100 mcg by mouth daily. (Patient not taking: Reported on 06/30/2020)   No current facility-administered medications on file prior to visit.    Review of Systems  Constitutional: Negative for activity change, appetite change, chills, diaphoresis, fatigue and fever.  HENT: Negative for congestion and hearing loss.   Eyes: Negative for visual disturbance.  Respiratory: Negative for cough, chest tightness, shortness of breath and wheezing.   Cardiovascular: Negative for chest  pain, palpitations and leg swelling.  Gastrointestinal: Negative for abdominal pain, constipation, diarrhea, nausea and vomiting.  Endocrine: Negative for cold intolerance.  Genitourinary: Negative for difficulty urinating, dysuria, frequency and hematuria.  Musculoskeletal: Negative for arthralgias and neck pain.  Skin: Negative for rash.  Allergic/Immunologic: Negative for environmental allergies.  Neurological: Negative for dizziness, weakness, light-headedness, numbness and headaches.  Hematological: Negative for adenopathy.  Psychiatric/Behavioral: Negative for behavioral problems, dysphoric mood and sleep disturbance.   Per HPI unless specifically indicated above      Objective:    BP 134/79 (BP Location: Left Arm, Cuff Size: Normal)   Pulse 80   Temp (!) 97.5 F (36.4 C) (Temporal)   Resp 16   Ht 5\' 8"  (1.727 m)   Wt 257 lb 9.6 oz (116.8 kg)   SpO2 97%   BMI 39.17 kg/m   Wt Readings from Last 3 Encounters:  06/30/20 257 lb 9.6 oz (116.8 kg)  06/27/19 262 lb (118.8 kg)  05/17/19 260 lb 9.6 oz (118.2 kg)    Physical Exam Vitals and nursing note reviewed.  Constitutional:      General: Cody Armstrong is not in acute distress.    Appearance: Cody Armstrong is well-developed and well-nourished. Cody Armstrong is not diaphoretic.     Comments: Well-appearing, comfortable, cooperative  HENT:     Head: Normocephalic and atraumatic.     Mouth/Throat:     Mouth: Oropharynx is clear and moist.  Eyes:     General:        Right eye: No discharge.        Left eye: No discharge.     Extraocular Movements: EOM normal.     Conjunctiva/sclera: Conjunctivae normal.     Pupils: Pupils are equal, round, and reactive to light.  Neck:     Thyroid: No thyromegaly.     Vascular: No carotid bruit.  Cardiovascular:     Rate and Rhythm: Normal rate and regular rhythm.     Pulses: Intact distal pulses.     Heart sounds: Normal heart sounds. No murmur heard.   Pulmonary:     Effort: Pulmonary effort is normal. No  respiratory distress.     Breath sounds: Normal breath sounds. No wheezing or rales.  Abdominal:     General: Bowel sounds are normal. There is no distension.     Palpations: Abdomen is soft. There is no mass.     Tenderness: There is no abdominal tenderness.  Musculoskeletal:        General: No tenderness or edema. Normal range of motion.     Cervical back: Normal range of motion and neck supple.     Right lower leg: No edema.     Left lower leg: No edema.     Comments: Upper / Lower Extremities: - Normal muscle tone, strength bilateral upper extremities 5/5, lower extremities 5/5  Lymphadenopathy:     Cervical: No cervical adenopathy.  Skin:    General: Skin is warm and dry.     Findings: No erythema or  rash.  Neurological:     Mental Status: Cody Armstrong is alert and oriented to person, place, and time.     Comments: Distal sensation intact to light touch all extremities  Psychiatric:        Mood and Affect: Mood and affect normal.        Behavior: Behavior normal.     Comments: Well groomed, good eye contact, normal speech and thoughts        Results for orders placed or performed in visit on 06/20/20  [facility] - PSA physical  Result Value Ref Range   PSA 1.41 < OR = 4.0 ng/mL  [facility] - Lipid panel physical  Result Value Ref Range   Cholesterol 214 (H) <200 mg/dL   HDL 49 > OR = 40 mg/dL   Triglycerides 038 (H) <150 mg/dL   LDL Cholesterol (Calc) 125 (H) mg/dL (calc)   Total CHOL/HDL Ratio 4.4 <5.0 (calc)   Non-HDL Cholesterol (Calc) 165 (H) <130 mg/dL (calc)  [facility] - CMET w/ GFR CMP Complete Metabolic Panel physical  Result Value Ref Range   Glucose, Bld 114 (H) 65 - 99 mg/dL   BUN 18 7 - 25 mg/dL   Creat 3.33 8.32 - 9.19 mg/dL   GFR, Est Non African American 83 > OR = 60 mL/min/1.56m2   GFR, Est African American 97 > OR = 60 mL/min/1.59m2   BUN/Creatinine Ratio NOT APPLICABLE 6 - 22 (calc)   Sodium 141 135 - 146 mmol/L   Potassium 4.6 3.5 - 5.3 mmol/L   Chloride 105 98 - 110  mmol/L   CO2 26 20 - 32 mmol/L   Calcium 10.0 8.6 - 10.3 mg/dL   Total Protein 7.1 6.1 - 8.1 g/dL   Albumin 4.6 3.6 - 5.1 g/dL   Globulin 2.5 1.9 - 3.7 g/dL (calc)   AG Ratio 1.8 1.0 - 2.5 (calc)   Total Bilirubin 0.6 0.2 - 1.2 mg/dL   Alkaline phosphatase (APISO) 67 35 - 144 U/L   AST 33 10 - 35 U/L   ALT 77 (H) 9 - 46 U/L  [facility] - CBC with Differential/Platelet physical  Result Value Ref Range   WBC 6.4 3.8 - 10.8 Thousand/uL   RBC 5.65 4.20 - 5.80 Million/uL   Hemoglobin 16.8 13.2 - 17.1 g/dL   HCT 16.6 06.0 - 04.5 %   MCV 86.9 80.0 - 100.0 fL   MCH 29.7 27.0 - 33.0 pg   MCHC 34.2 32.0 - 36.0 g/dL   RDW 99.7 74.1 - 42.3 %   Platelets 262 140 - 400 Thousand/uL   MPV 9.8 7.5 - 12.5 fL   Neutro Abs 3,552 1,500 - 7,800 cells/uL   Lymphs Abs 1,933 850 - 3,900 cells/uL   Absolute Monocytes 506 200 - 950 cells/uL   Eosinophils Absolute 352 15 - 500 cells/uL   Basophils Absolute 58 0 - 200 cells/uL   Neutrophils Relative % 55.5 %   Total Lymphocyte 30.2 %   Monocytes Relative 7.9 %   Eosinophils Relative 5.5 %   Basophils Relative 0.9 %  [facility] - A1c LAB Hemoglobin A1C physical  Result Value Ref Range   Hgb A1c MFr Bld 5.7 (H) <5.7 % of total Hgb   Mean Plasma Glucose 117 mg/dL   eAG (mmol/L) 6.5 mmol/L      Assessment & Plan:   Problem List Items Addressed This Visit    Primary osteoarthritis of both hands    Clinically with bilateral R>L moderate advanced osteoarthritis with  evidence on exam Secondary to chronic overuse wear and tear No injury No other complication, intact nerve / circulation  Plan - Discussed progression of arthritis and treatments - Keep on regimen - Tylenol Ext Str 500 take 2 for 1000mg  TID PRN - Cody Armstrong may use Ibuprofen PO PRN as discussed - Re-Try Voltaren topical OTC - START Gabapentin 100mg  titration as advised - Compression, activity modification - Future follow-up consider other meds, x-ray, referral possible [facility]MC injection      Relevant  Medications   gabapentin (NEURONTIN) 100 MG capsule   Obesity (BMI 35.0-39.9 without comorbidity)   Mixed hyperlipidemia    Mostly controlled cholesterol LDL HDL on lifestyle, has elevated TG now w/ higher sugar Last lipid panel 06/2020 The 10-year ASCVD risk score Denman George(Goff DC Jr., et al., 2013) is: 21.9%  Plan: 1. Offered ASCVD risk reduction primary with Statin therapy - Cody Armstrong declined, despite counseling benefit risk 2. Encourage improved lifestyle - low carb/cholesterol, reduce portion size, continue improving regular exercise Yearly lipid      Relevant Medications   amLODipine (NORVASC) 10 MG tablet   losartan (COZAAR) 100 MG tablet   Essential hypertension    Mild elevated BP on initial, then repeat improved - Home BP readings none  No known complications     Plan:  1. Continue current BP regimen - Losartan 100mg  daily, Amlodipine 10mg  daily 2. Encourage improved lifestyle - low sodium diet, regular exercise 3. May monitor BP outside office      Relevant Medications   amLODipine (NORVASC) 10 MG tablet   losartan (COZAAR) 100 MG tablet   Elevated hemoglobin A1c    Mild elevated A1c to 5.7, at range of early PreDM now Concern with obesity, HTN, HLD  Plan:  1. Not on any therapy currently  2. Encourage improved lifestyle - low carb, low sugar diet, reduce portion size, continue improving regular exercise - diet handout given Yearly check        Other Visit Diagnoses    Annual physical exam    -  Primary      Updated Health Maintenance information Reviewed recent lab results with patient Encouraged improvement to lifestyle with diet and exercise - Goal of weight loss   Meds ordered this encounter  Medications  . gabapentin (NEURONTIN) 100 MG capsule    Sig: Start 1 capsule daily, increase by 1 cap every 2-3 days as tolerated up to 3 times a day, or may take 3 at once in evening.    Dispense:  90 capsule    Refill:  3  . amLODipine (NORVASC) 10 MG tablet     Sig: Take 1 tablet (10 mg total) by mouth daily.    Dispense:  90 tablet    Refill:  3  . losartan (COZAAR) 100 MG tablet    Sig: Take 1 tablet (100 mg total) by mouth daily.    Dispense:  90 tablet    Refill:  3      Follow up plan: Return in about 1 year (around 06/30/2021) for 1 year fasting lab only then 1 week later Annual Physical.  Future labs ordered for 06/25/21  Saralyn PilarAlexander Carina Chaplin, DO [facility] 06/30/2020, 11:03 AM

## 2020-06-30 NOTE — Patient Instructions (Addendum)
Thank you for coming to the office today.  Recommend topical Voltaren (diclofenac) try to use this up to 1-4 times a day as needed for pain relief, it is anti inflammatory, you can use less of the ibuprofen.  Start Gabapentin 100mg  capsules, take at night for 2-3 nights only, and then increase to 2 times a day for a few days, and then may increase to 3 times a day, it may make you drowsy, if helps significantly at night only, then you can increase instead to 3 capsules at night, instead of 3 times a day - In the future if needed, we can significantly increase the dose if tolerated well, some common doses are 300mg  three times a day up to 600mg  three times a day, usually it takes several weeks or months to get to higher doses  Recommend to start taking Tylenol Extra Strength 500mg  tabs - take 1 to 2 tabs per dose (max 1000mg ) every 6-8 hours for pain (take regularly, don't skip a dose for next 7 days), max 24 hour daily dose is 6 tablets or 3000mg . In the future you can repeat the same everyday Tylenol course for 1-2 weeks at a time.     Please schedule a Follow-up Appointment to: Return in about 1 year (around 06/30/2021) for 1 year fasting lab only then 1 week later Annual Physical.  If you have any other questions or concerns, please feel free to call the office or send a message through MyChart. You may also schedule an earlier appointment if necessary.  Additionally, you may be receiving a survey about your experience at our office within a few days to 1 week by e-mail or mail. We value your feedback.  , DO [facility], [facility]   Fat and Cholesterol Restricted Eating Plan Getting too much fat and cholesterol in your diet may cause health problems. Choosing the right foods helps keep your fat and cholesterol at normal levels. This can keep you from getting certain diseases. Your doctor may recommend an eating plan that includes:  Total fat: ______% or  less of total calories a day.  Saturated fat: ______% or less of total calories a day.  Cholesterol: less than _________mg a day.  Fiber: ______g a day. What are tips for following this plan? Meal planning  At meals, divide your plate into four equal parts: ? Fill one-half of your plate with vegetables and green salads. ? Fill one-fourth of your plate with whole grains. ? Fill one-fourth of your plate with low-fat (lean) protein foods.  Eat fish that is high in omega-3 fats at least two times a week. This includes mackerel, tuna, sardines, and salmon.  Eat foods that are high in fiber, such as whole grains, beans, apples, broccoli, carrots, peas, and barley. General tips   Work with your doctor to lose weight if you need to.  Avoid: ? Foods with added sugar. ? Fried foods. ? Foods with partially hydrogenated oils.  Limit alcohol intake to no more than 1 drink a day for nonpregnant women and 2 drinks a day for men. One drink equals 12 oz of beer, 5 oz of wine, or 1 oz of hard liquor. Reading food labels  Check food labels for: ? Trans fats. ? Partially hydrogenated oils. ? Saturated fat (g) in each serving. ? Cholesterol (mg) in each serving. ? Fiber (g) in each serving.  Choose foods with healthy fats, such as: ? Monounsaturated fats. ? Polyunsaturated fats. ? Omega-3 fats.  Choose grain products that have whole grains. Look for the word "whole" as the first word in the ingredient list. Cooking  Cook foods using low-fat methods. These include baking, boiling, grilling, and broiling.  Eat more home-cooked foods. Eat at restaurants and buffets less often.  Avoid cooking using saturated fats, such as butter, cream, palm oil, palm kernel oil, and coconut oil. Recommended foods  Fruits  All fresh, canned (in natural juice), or frozen fruits. Vegetables  Fresh or frozen vegetables (raw, steamed, roasted, or grilled). Green salads. Grains  Whole grains, such as  whole wheat or whole grain breads, crackers, cereals, and pasta. Unsweetened oatmeal, bulgur, barley, quinoa, or brown rice. Corn or whole wheat flour tortillas. Meats and other protein foods  Ground beef (85% or leaner), grass-fed beef, or beef trimmed of fat. Skinless chicken or [place]. Ground chicken or [place]. Pork trimmed of fat. All fish and seafood. Egg whites. Dried beans, peas, or lentils. Unsalted nuts or seeds. Unsalted canned beans. Nut butters without added sugar or oil. Dairy  Low-fat or nonfat dairy products, such as skim or 1% milk, 2% or reduced-fat cheeses, low-fat and fat-free ricotta or cottage cheese, or plain low-fat and nonfat yogurt. Fats and oils  Tub margarine without trans fats. Light or reduced-fat mayonnaise and salad dressings. Avocado. Olive, canola, sesame, or safflower oils. The items listed above may not be a complete list of foods and beverages you can eat. Contact a dietitian for more information. Foods to avoid Fruits  Canned fruit in heavy syrup. Fruit in cream or butter sauce. Fried fruit. Vegetables  Vegetables cooked in cheese, cream, or butter sauce. Fried vegetables. Grains  White bread. White pasta. White rice. Cornbread. Bagels, pastries, and croissants. Crackers and snack foods that contain trans fat and hydrogenated oils. Meats and other protein foods  Fatty cuts of meat. Ribs, chicken wings, bacon, sausage, bologna, salami, chitterlings, fatback, hot dogs, bratwurst, and packaged lunch meats. Liver and organ meats. Whole eggs and egg yolks. Chicken and [place] with skin. Fried meat. Dairy  Whole or 2% milk, cream, half-and-half, and cream cheese. Whole milk cheeses. Whole-fat or sweetened yogurt. Full-fat cheeses. Nondairy creamers and whipped toppings. Processed cheese, cheese spreads, and cheese curds. Beverages  Alcohol. Sugar-sweetened drinks such as sodas, lemonade, and fruit drinks. Fats and oils  Butter, stick margarine, lard,  shortening, ghee, or bacon fat. Coconut, palm kernel, and palm oils. Sweets and desserts  Corn syrup, sugars, honey, and molasses. Candy. Jam and jelly. Syrup. Sweetened cereals. Cookies, pies, cakes, donuts, muffins, and ice cream. The items listed above may not be a complete list of foods and beverages you should avoid. Contact a dietitian for more information. Summary  Choosing the right foods helps keep your fat and cholesterol at normal levels. This can keep you from getting certain diseases.  At meals, fill one-half of your plate with vegetables and green salads.  Eat high-fiber foods, like whole grains, beans, apples, carrots, peas, and barley.  Limit added sugar, saturated fats, alcohol, and fried foods. This information is not intended to replace advice given to you by your health care provider. Make sure you discuss any questions you have with your health care provider. Document Revised: 03/01/2018 Document Reviewed: 03/15/2017 Elsevier Patient Education  2020 [employer].

## 2020-06-30 NOTE — Assessment & Plan Note (Addendum)
Mild elevated BP on initial, then repeat improved - Home BP readings none  No known complications     Plan:  1. Continue current BP regimen - Losartan 100mg  daily, Amlodipine 10mg  daily 2. Encourage improved lifestyle - low sodium diet, regular exercise 3. May monitor BP outside office

## 2020-06-30 NOTE — Assessment & Plan Note (Signed)
Mostly controlled cholesterol LDL HDL on lifestyle, has elevated TG now w/ higher sugar Last lipid panel 06/2020 The 10-year ASCVD risk score Denman George DC Jr., et al., 2013) is: 21.9%  Plan: 1. Offered ASCVD risk reduction primary with Statin therapy - he declined, despite counseling benefit risk 2. Encourage improved lifestyle - low carb/cholesterol, reduce portion size, continue improving regular exercise Yearly lipid

## 2020-07-09 ENCOUNTER — Telehealth: Payer: Self-pay | Admitting: Family Medicine

## 2020-07-09 DIAGNOSIS — M19041 Primary osteoarthritis, right hand: Secondary | ICD-10-CM

## 2020-07-09 NOTE — Telephone Encounter (Signed)
Pt. stopped by the office said that the medication  Gabapentin was not for his arthritis requesting a new  Medication to be called in

## 2020-07-09 NOTE — Telephone Encounter (Signed)
I called patient on 07/09/20 approx 440pm did not reach him, I left him detailed voicemail.  We just spoke about this problem with his arthritis pain in detail at his office visit 06/30/20, at that time we decided on the following Plan:   - Tylenol Ext Str 500 take 2 for 1000mg  TID PRN - He may use Ibuprofen PO PRN as discussed - Re-Try Voltaren topical OTC - START Gabapentin 100mg  titration as advised  He was interested in trial on Gabapentin at that time.  I hear now that he has only tried it briefly and wants to switch medicines because he was told by someone else that this medicine is not for arthritis.  Technically that is correct - Gabapentin is not an "arthritis" medication, it is originally used for nerve conditions primarily pain.  We use it often for joint pain and back pain and takes time to determine if effective, often the dosage needs to be gradually increased over several days to weeks to determine if effective.  I can try to talk to him briefly on phone tomorrow if he is available, but just a quick question and answer only and may be able to find a suitable rx for him.  , DO [facility] 07/09/2020, 4:44 PM

## 2020-07-10 MED ORDER — MELOXICAM 15 MG PO TABS: 15.0000 mg | ORAL_TABLET | Freq: Every day | ORAL | 2 refills | Status: DC | PRN

## 2020-07-10 NOTE — Telephone Encounter (Signed)
Pt states he does NOT want to take gabapentin. He did not have side effects, he said when he got to looking into the medication, it was just not what he wanted. Pt does prefer to take the Meloxicam. Pt states cancel the other and send in the  New med please. Went over the instructions left by Dr Kirtland Bouchard and he is ok with it and pt verbalized understanding.  [id number] - [place], [place] - [address] AT [facility] OF [address] & [address] [facility]

## 2020-07-10 NOTE — Telephone Encounter (Signed)
Thanks.  Sent rx Meloxicam 15mg  daily PRN 30 pill +2 refill.  DC'd Gabapentin.  Follow-up as needed  , DO [facility] 07/10/2020, 11:20 AM

## 2020-07-10 NOTE — Addendum Note (Signed)
Addended by: Smitty Cords on: 07/10/2020 11:20 AM   Modules accepted: Orders

## 2020-07-10 NOTE — Telephone Encounter (Signed)
I tried to call him again today 12/30 at 1030am. Did not reach him.  If you can try to reach him or if he calls back, then I would confirm that he prefers to not take Gabapentin (find out if he had a side effect or if he just wants to stop taking it by preference).  If he is still asking about switching to a different arthritis medication instead, then I would offer Meloxicam.  It is a prescription strength anti inflammatory. He could take one pill daily AS NEEDED on days of arthritis pain. He should skip it or not take it on days he is doing well. It is not to be taken every day. He may even take it for example 1 week then stop it for 1-2 weeks, then take again etc.  Any anti inflammatory can cause side effect and problem kidneys and GI stomach.  He should STOP taking ALL OTC anti inflammatories - Ibuprofen, Advil, Aleve, Naproxen, Motrin.  Tylenol is safe to take.  Topical Voltaren is safe to take.  Saralyn Pilar, DO [facility] 07/10/2020, 10:30 AM

## 2021-01-05 ENCOUNTER — Telehealth: Payer: Self-pay | Admitting: Family Medicine

## 2021-01-05 NOTE — Telephone Encounter (Signed)
Patient declined the Medicare Wellness Visit with [facility] .   He stated he does not need to complete this appointment and that he only needs to see  the PCP once a year

## 2021-06-12 ENCOUNTER — Ambulatory Visit: Payer: Self-pay | Admitting: *Deleted

## 2021-06-12 ENCOUNTER — Telehealth: Payer: Self-pay

## 2021-06-12 NOTE — Telephone Encounter (Signed)
Copied from CRM [id number]. Topic: General - Other >> Jun 12, 2021  2:42 PM Jaquita Rector A wrote: Reason for CRM: Patient called in and want to know when he can go to the Pharmacy to pick up medication for Covid. Saying that he does not want to wait all day would like a call back Ph# [phone]   Spoke with pt. He will go online and have a virtual visit through the [facility] website.

## 2021-06-12 NOTE — Telephone Encounter (Signed)
Pt called in stating he has tested positive for covid and wanted to speak with someone about what he needs to do. Please advise.  Reason for Disposition  [1] HIGH RISK for severe COVID complications (e.g., weak immune system, age > 64 years, obesity with BMI > 25, pregnant, chronic lung disease or other chronic medical condition) AND [2] COVID symptoms (e.g., cough, fever)  (Exceptions: Already seen by PCP and no new or worsening symptoms.)  Answer Assessment - Initial Assessment Questions 1. COVID-19 DIAGNOSIS: "Who made your COVID-19 diagnosis?" "Was it confirmed by a positive lab test or self-test?" If not diagnosed by a doctor (or NP/PA), ask "Are there lots of cases (community spread) where you live?" Note: See public health department website, if unsure.     + COVID home test 2. COVID-19 EXPOSURE: "Was there any known exposure to COVID before the symptoms began?" CDC Definition of close contact: within 6 feet (2 meters) for a total of 15 minutes or more over a 24-hour period.      Exposed with friend 3. ONSET: "When did the COVID-19 symptoms start?"      This morning 4. WORST SYMPTOM: "What is your worst symptom?" (e.g., cough, fever, shortness of breath, muscle aches)     Headache, chills 5. COUGH: "Do you have a cough?" If Yes, ask: "How bad is the cough?"       Yes- dry 6. FEVER: "Do you have a fever?" If Yes, ask: "What is your temperature, how was it measured, and when did it start?"     chills 7. RESPIRATORY STATUS: "Describe your breathing?" (e.g., shortness of breath, wheezing, unable to speak)      normal 8. BETTER-SAME-WORSE: "Are you getting better, staying the same or getting worse compared to yesterday?"  If getting worse, ask, "In what way?"     Worse- symptoms 9. HIGH RISK DISEASE: "Do you have any chronic medical problems?" (e.g., asthma, heart or lung disease, weak immune system, obesity, etc.)     Age, high blood pressure 10. VACCINE: "Have you had the COVID-19  vaccine?" If Yes, ask: "Which one, how many shots, when did you get it?"       Yes- pfizer  11. BOOSTER: "Have you received your COVID-19 booster?" If Yes, ask: "Which one and when did you get it?"       Yes- October 2022 12. PREGNANCY: "Is there any chance you are pregnant?" "When was your last menstrual period?"       na 13. OTHER SYMPTOMS: "Do you have any other symptoms?"  (e.g., chills, fatigue, headache, loss of smell or taste, muscle pain, sore throat)       Chills, cough 14. O2 SATURATION MONITOR:  "Do you use an oxygen saturation monitor (pulse oximeter) at home?" If Yes, ask "What is your reading (oxygen level) today?" "What is your usual oxygen saturation reading?" (e.g., 95%)       na  Protocols used: Coronavirus (COVID-19) Diagnosed or Suspected-A-AH

## 2021-06-12 NOTE — Telephone Encounter (Signed)
Patient reports + COVID home test today- symptoms started today- cough, chills, headache. Patient advised per COVID protocol-treatment/isolation. Patient is requesting antiviral - Please call him back.

## 2021-06-16 ENCOUNTER — Ambulatory Visit: Payer: Self-pay | Admitting: *Deleted

## 2021-06-16 ENCOUNTER — Telehealth: Payer: Medicare Other | Admitting: Family Medicine

## 2021-06-16 DIAGNOSIS — U071 COVID-19: Secondary | ICD-10-CM

## 2021-06-16 MED ORDER — MOLNUPIRAVIR EUA 200MG CAPSULE: 4.0000 | ORAL_CAPSULE | Freq: Two times a day (BID) | ORAL | 0 refills | Status: AC

## 2021-06-16 NOTE — Patient Instructions (Addendum)
Please keep well-hydrated and get plenty of rest. Start a saline nasal rinse to flush out your nasal passages. You can use plain Mucinex to help thin congestion. If you have a humidifier, running in the bedroom at night. I want you to start OTC vitamin D3 1000 units daily, vitamin C 1000 mg daily, and a zinc supplement. Please take prescribed medications as directed.  You have been enrolled in a MyChart symptom monitoring program. Please answer these questions daily so we can keep track of how you are doing.  You were to quarantine for 5 days from onset of your symptoms.  After day 5, if you have had no fever and you are feeling better, you can end quarantine but need to mask for an additional 5 days. After day 5 if you have a fever or are having significant symptoms, please quarantine for full 10 days.  If you note any worsening of symptoms, any significant shortness of breath or any chest pain, please seek ER evaluation ASAP.  Please do not delay care!  COVID-19: What to Do if You Are Sick If you test positive and are an older adult or someone who is at high risk of getting very sick from COVID-19, treatment may be available. Contact a healthcare provider right away after a positive test to determine if you are eligible, even if your symptoms are mild right now. You can also visit a Test to Treat location and, if eligible, receive a prescription from a provider. Don't delay: Treatment must be started within the first few days to be effective. If you have a fever, cough, or other symptoms, you might have COVID-19. Most people have mild illness and are able to recover at home. If you are sick: Keep track of your symptoms. If you have an emergency warning sign (including trouble breathing), call 911. Steps to help prevent the spread of COVID-19 if you are sick If you are sick with COVID-19 or think you might have COVID-19, follow the steps below to care for yourself and to help protect other  people in your home and community. Stay home except to get medical care Stay home. Most people with COVID-19 have mild illness and can recover at home without medical care. Do not leave your home, except to get medical care. Do not visit public areas and do not go to places where you are unable to wear a mask. Take care of yourself. Get rest and stay hydrated. Take over-the-counter medicines, such as acetaminophen, to help you feel better. Stay in touch with your doctor. Call before you get medical care. Be sure to get care if you have trouble breathing, or have any other emergency warning signs, or if you think it is an emergency. Avoid public transportation, ride-sharing, or taxis if possible. Get tested If you have symptoms of COVID-19, get tested. While waiting for test results, stay away from others, including staying apart from those living in your household. Get tested as soon as possible after your symptoms start. Treatments may be available for people with COVID-19 who are at risk for becoming very sick. Don't delay: Treatment must be started early to be effective--some treatments must begin within 5 days of your first symptoms. Contact your healthcare provider right away if your test result is positive to determine if you are eligible. Self-tests are one of several options for testing for the virus that causes COVID-19 and may be more convenient than laboratory-based tests and point-of-care tests. Ask your healthcare provider or   your local health department if you need help interpreting your test results. You can visit your state, tribal, local, and territorial health department's website to look for the latest local information on testing sites. Separate yourself from other people As much as possible, stay in a specific room and away from other people and pets in your home. If possible, you should use a separate bathroom. If you need to be around other people or animals in or outside of the  home, wear a well-fitting mask. Tell your close contacts that they [place] have been exposed to COVID-19. An infected person can spread COVID-19 starting 48 hours (or 2 days) before the person has any symptoms or tests positive. By letting your close contacts know they Feinstein have been exposed to COVID-19, you are helping to protect everyone. See COVID-19 and Animals if you have questions about pets. If you are diagnosed with COVID-19, someone from the health department [place] call you. Answer the call to slow the spread. Monitor your symptoms Symptoms of COVID-19 include fever, cough, or other symptoms. Follow care instructions from your healthcare provider and local health department. Your local health authorities [place] give instructions on checking your symptoms and reporting information. When to seek emergency medical attention Look for emergency warning signs* for COVID-19. If someone is showing any of these signs, seek emergency medical care immediately: Trouble breathing Persistent pain or pressure in the chest New confusion Inability to wake or stay awake Pale, gray, or blue-colored skin, lips, or nail beds, depending on skin tone *This list is not all possible symptoms. Please call your medical provider for any other symptoms that are severe or concerning to you. Call 911 or call ahead to your local emergency facility: Notify the operator that you are seeking care for someone who has or [place] have COVID-19. Call ahead before visiting your doctor Call ahead. Many medical visits for routine care are being postponed or done by phone or telemedicine. If you have a medical appointment that cannot be postponed, call your doctor's office, and tell them you have or [place] have COVID-19. This will help the office protect themselves and other patients. If you are sick, wear a well-fitting mask You should wear a mask if you must be around other people or animals, including pets (even at home). Wear a mask with the  best fit, protection, and comfort for you. You don't need to wear the mask if you are alone. If you can't put on a mask (because of trouble breathing, for example), cover your coughs and sneezes in some other way. Try to stay at least 6 feet away from other people. This will help protect the people around you. Masks should not be placed on young children under age 2 years, anyone who has trouble breathing, or anyone who is not able to remove the mask without help. Cover your coughs and sneezes Cover your mouth and nose with a tissue when you cough or sneeze. Throw away used tissues in a lined trash can. Immediately wash your hands with soap and water for at least 20 seconds. If soap and water are not available, clean your hands with an alcohol-based hand sanitizer that contains at least 60% alcohol. Clean your hands often Wash your hands often with soap and water for at least 20 seconds. This is especially important after blowing your nose, coughing, or sneezing; going to the bathroom; and before eating or preparing food. Use hand sanitizer if soap and water are not available. Use an   Use an alcohol-based hand sanitizer with at least 60% alcohol, covering all surfaces of your hands and rubbing them together until they feel dry. °Soap and water are the best option, especially if hands are visibly dirty. °Avoid touching your eyes, nose, and mouth with unwashed hands. °Handwashing Tips °Avoid sharing personal household items °Do not share dishes, drinking glasses, cups, eating utensils, towels, or bedding with other people in your home. °Wash these items thoroughly after using them with soap and water or put in the dishwasher. °Clean surfaces in your home regularly °Clean and disinfect high-touch surfaces (for example, doorknobs, tables, handles, light switches, and countertops) in your "sick room" and bathroom. In shared spaces, you should clean and disinfect surfaces and items after each use by the person who is  ill. °If you are sick and cannot clean, a caregiver or other person should only clean and disinfect the area around you (such as your bedroom and bathroom) on an as needed basis. Your caregiver/other person should wait as long as possible (at least several hours) and wear a mask before entering, cleaning, and disinfecting shared spaces that you use. °Clean and disinfect areas that may have blood, stool, or body fluids on them. °Use household cleaners and disinfectants. Clean visible dirty surfaces with household cleaners containing soap or detergent. Then, use a household disinfectant. °Use a product from EPA's List N: Disinfectants for Coronavirus (COVID-19). °Be sure to follow the instructions on the label to ensure safe and effective use of the product. Many products recommend keeping the surface wet with a disinfectant for a certain period of time (look at "contact time" on the product label). °You may also need to wear personal protective equipment, such as gloves, depending on the directions on the product label. °Immediately after disinfecting, wash your hands with soap and water for 20 seconds. °For completed guidance on cleaning and disinfecting your home, visit Complete Disinfection Guidance. °Take steps to improve ventilation at home °Improve ventilation (air flow) at home to help prevent from spreading COVID-19 to other people in your household. °Clear out COVID-19 virus particles in the air by opening windows, using air filters, and turning on fans in your home. °Use this interactive tool to learn how to improve air flow in your home. °When you can be around others after being sick with COVID-19 °Deciding when you can be around others is different for different situations. Find out when you can safely end home isolation. °For any additional questions about your care, contact your healthcare provider or state or local health department. °09/30/2020 °Content source: National Center for Immunization and  Respiratory Diseases (NCIRD), Division of Viral Diseases °This information is not intended to replace advice given to you by your health care provider. Make sure you discuss any questions you have with your health care provider. °Document Revised: 11/13/2020 Document Reviewed: 11/13/2020 °Elsevier Patient Education © 2022 Elsevier Inc. ° °Molnupiravir Oral Capsules °What is this medication? °MOLNUPIRAVIR (mol nue pir a vir) treats COVID-19. It is an antiviral medication. It may decrease the risk of developing severe symptoms of COVID-19. It may also decrease the chance of going to the hospital. This medication is not approved by the FDA. The FDA has authorized emergency use of this medication during the COVID-19 pandemic. °This medicine may be used for other purposes; ask your health care provider or pharmacist if you have questions. °COMMON BRAND NAME(S): LAGEVRIO °What should I tell my care team before I take this medication? °They need to know if you   any of these conditions: Any allergies Any serious illness An unusual or allergic reaction to molnupiravir, other medications, foods, dyes, or preservatives Pregnant or trying to get pregnant Breast-feeding How should I use this medication? Take this medication by mouth with water. Take it as directed on the prescription label at the same time every day. Do not cut, crush or chew this medication. Swallow the capsules whole. You can take it with or without food. If it upsets your stomach, take it with food. Take all of this medication unless your care team tells you to stop it early. Keep taking it even if you think you are better. Talk to your care team about the use of this medication in children. Special care may be needed. Overdosage: If you think you have taken too much of this medicine contact a poison control center or emergency room at once. NOTE: This medicine is only for you. Do not share this medicine with others. What if I miss a  dose? If you miss a dose, take it as soon as you can unless it is more than 10 hours late. If it is more than 10 hours late, skip the missed dose. Take the next dose at the normal time. Do not take extra or 2 doses at the same time to make up for the missed dose. What may interact with this medication? Interactions have not been studied. This list may not describe all possible interactions. Give your health care provider a list of all the medicines, herbs, non-prescription drugs, or dietary supplements you use. Also tell them if you smoke, drink alcohol, or use illegal drugs. Some items may interact with your medicine. What should I watch for while using this medication? Your condition will be monitored carefully while you are receiving this medication. Visit your care team for regular checkups. Tell your care team if your symptoms do not start to get better or if they get worse. Do not become pregnant while taking this medication. You may need a pregnancy test before starting this medication. Women must use a reliable form of birth control while taking this medication and for 4 days after stopping the medication. Women should inform their care team if they wish to become pregnant or think they might be pregnant. Men should not father a child while taking this medication and for 3 months after stopping it. There is potential for serious harm to an unborn child. Talk to your care team for more information. Do not breast-feed an infant while taking this medication and for 4 days after stopping the medication. What side effects may I notice from receiving this medication? Side effects that you should report to your care team as soon as possible: Allergic reactions--skin rash, itching, hives, swelling of the face, lips, tongue, or throat Side effects that usually do not require medical attention (report these to your care team if they continue or are bothersome): Diarrhea Dizziness Nausea This list may  not describe all possible side effects. Call your doctor for medical advice about side effects. You may report side effects to FDA at 1-800-FDA-1088. Where should I keep my medication? Keep out of the reach of children and pets. Store at room temperature between 20 and 25 degrees C (68 and 77 degrees F). Get rid of any unused medication after the expiration date. To get rid of medications that are no longer needed or have expired: Take the medication to a medication take-back program. Check with your pharmacy or law enforcement to find  find a location. °If you cannot return the medication, check the label or package insert to see if the medication should be thrown out in the garbage or flushed down the toilet. If you are not sure, ask your care team. If it is safe to put it in the trash, take the medication out of the container. Mix the medication with cat litter, dirt, coffee grounds, or other unwanted substance. Seal the mixture in a bag or container. Put it in the trash. °NOTE: This sheet is a summary. It may not cover all possible information. If you have questions about this medicine, talk to your doctor, pharmacist, or health care provider. °© 2022 Elsevier/Gold Standard (2020-07-07 00:00:00) ° ° ° ° °

## 2021-06-16 NOTE — Progress Notes (Signed)
Virtual Visit Consent   [name], you are scheduled for a virtual visit with a [facility] provider today.     Just as with appointments in the office, your consent must be obtained to participate.  Your consent will be active for this visit and any virtual visit you may have with one of our providers in the next 365 days.     If you have a MyChart account, a copy of this consent can be sent to you electronically.  All virtual visits are billed to your insurance company just like a traditional visit in the office.    As this is a virtual visit, video technology does not allow for your provider to perform a traditional examination.  This may limit your provider's ability to fully assess your condition.  If your provider identifies any concerns that need to be evaluated in person or the need to arrange testing (such as labs, EKG, etc.), we will make arrangements to do so.     Although advances in technology are sophisticated, we cannot ensure that it will always work on either your end or our end.  If the connection with a video visit is poor, the visit may have to be switched to a telephone visit.  With either a video or telephone visit, we are not always able to ensure that we have a secure connection.     I need to obtain your verbal consent now.   Are you willing to proceed with your visit today?    Cody Armstrong has provided verbal consent on 06/16/2021 for a virtual visit (video or telephone).   Freddy Finner, NP   Date: 06/16/2021 9:23 AM   Virtual Visit via Video Note   I, Freddy Finner, connected with  Cody Armstrong  ([id number], Jul 07, 1942) on 06/16/21 at  9:20 AM EST by a video-enabled telemedicine application and verified that I am speaking with the correct person using two identifiers.  Location: Patient: Virtual Visit Location Patient: Home Provider: Virtual Visit Location Provider: Home Office   I discussed the limitations of evaluation and management by telemedicine  and the availability of in person appointments. The patient expressed understanding and agreed to proceed.    History of Present Illness: Cody Armstrong is a 70 y.o. who identifies as a male who was assigned male at birth, and is being seen today for tested + on Friday for COVID. Friday onset with drainage, sore throat, cough. Some pain and aches all over.   Denies fevers, chills, chest pain, shortness of breath, ear pain.  Reports exposure early part of last week around 11/30.  Problems:  Patient Active Problem List   Diagnosis Date Noted   Elevated hemoglobin A1c 06/27/2019   Mixed hyperlipidemia 06/27/2019   Primary osteoarthritis of both hands 05/17/2019   Essential hypertension 05/17/2019   Obesity (BMI 35.0-39.9 without comorbidity) 05/17/2019    Allergies: No Known Allergies Medications:  Current Outpatient Medications:    amLODipine (NORVASC) 10 MG tablet, Take 1 tablet (10 mg total) by mouth daily., Disp: 90 tablet, Rfl: 3   losartan (COZAAR) 100 MG tablet, Take 1 tablet (100 mg total) by mouth daily., Disp: 90 tablet, Rfl: 3   meloxicam (MOBIC) 15 MG tablet, Take 1 tablet (15 mg total) by mouth daily as needed for pain., Disp: 30 tablet, Rfl: 2   Omega-3 Fatty Acids (FISH OIL) 1000 MG CAPS, Take by mouth. (Patient not taking: Reported on 06/30/2020), Disp: , Rfl:    vitamin B-12 (  CYANOCOBALAMIN) 100 MCG tablet, Take 100 mcg by mouth daily. (Patient not taking: Reported on 06/30/2020), Disp: , Rfl:   Observations/Objective: Patient is well-developed, well-nourished in no acute distress.  Resting comfortably  at home.  Head is normocephalic, atraumatic.  No labored breathing.  Speech is clear and coherent with logical content.  Patient is alert and oriented at baseline.  Cough  Assessment and Plan:  1. COVID-19  - molnupiravir EUA (LAGEVRIO) 200 mg CAPS capsule; Take 4 capsules (800 mg total) by mouth 2 (two) times daily for 5 days.  Dispense: 40 capsule; Refill:  0  At cusp of starting antiviral desired to start OTC measures reviewed and on AVS   Reviewed side effects, risks and benefits of medication.    Patient acknowledged agreement and understanding of the plan.   I discussed the assessment and treatment plan with the patient. The patient was provided an opportunity to ask questions and all were answered. The patient agreed with the plan and demonstrated an understanding of the instructions.   The patient was advised to call back or seek an in-person evaluation if the symptoms worsen or if the condition fails to improve as anticipated.   The above assessment and management plan was discussed with the patient. The patient verbalized understanding of and has agreed to the management plan. Patient is aware to call the clinic if symptoms persist or worsen. Patient is aware when to return to the clinic for a follow-up visit. Patient educated on when it is appropriate to go to the emergency department.    Follow Up Instructions: I discussed the assessment and treatment plan with the patient. The patient was provided an opportunity to ask questions and all were answered. The patient agreed with the plan and demonstrated an understanding of the instructions.  A copy of instructions were sent to the patient via MyChart unless otherwise noted below.     The patient was advised to call back or seek an in-person evaluation if the symptoms worsen or if the condition fails to improve as anticipated.  Time:  I spent 10 minutes with the patient via telehealth technology discussing the above problems/concerns.    Freddy Finner, NP

## 2021-06-16 NOTE — Telephone Encounter (Signed)
Patient is calling to make an appointment- he states he called on Friday with + COVID. Patient states his symptoms are worse and he would like antiviral treatment. Call to office- left message with patient information. Patient advised virtual option- he is going to schedule.  Chief Complaint: medication request- COVID Symptoms: cough, congestion Frequency: + COVID Friday Pertinent Negatives: Patient denies difficulty breathing Disposition: [] ED /[] Urgent Care (no appt availability in office / [] Appointment(In office/virtual)/ [x]  [facility] [place] [facility]/ [] Home Care   Reason for Disposition  [1] COVID-19 diagnosed by positive lab test (e.g., PCR, rapid self-test kit) AND [2] mild symptoms (e.g., cough, fever, others) AND [2] no complications or SOB  Protocols used: Coronavirus (COVID-19) Diagnosed or Suspected-A-AH

## 2021-06-21 ENCOUNTER — Other Ambulatory Visit: Payer: Self-pay | Admitting: Family Medicine

## 2021-06-21 DIAGNOSIS — I10 Essential (primary) hypertension: Secondary | ICD-10-CM

## 2021-06-21 NOTE — Telephone Encounter (Signed)
Requested medication (s) are due for refill today: yes  Requested medication (s) are on the active medication list: yes  Last refill:  06/30/20 #90 3 RF  Future visit scheduled: yes  Notes to clinic:  overdue lab work-routed to office per protocol   Requested Prescriptions  Pending Prescriptions Disp Refills   losartan (COZAAR) 100 MG tablet [Pharmacy Med Name: LOSARTAN 100MG  TABLETS] 90 tablet     Sig: TAKE 1 TABLET(100 MG) BY MOUTH DAILY     Cardiovascular:  Angiotensin Receptor Blockers Failed - 06/21/2021  7:09 AM      Failed - Cr in normal range and within 180 days    Creat  Date Value Ref Range Status  06/23/2020 0.93 0.70 - 1.25 mg/dL Final    Comment:    For patients >3 years of age, the reference limit for Creatinine is approximately 13% higher for people identified as African-American. .           Failed - K in normal range and within 180 days    Potassium  Date Value Ref Range Status  06/23/2020 4.6 3.5 - 5.3 mmol/L Final          Failed - Valid encounter within last 6 months    Recent Outpatient Visits           11 months ago Annual physical exam   [facility], DO   1 year ago Annual physical exam   [facility], DO   2 years ago Essential hypertension   [facility], Althea Charon, DO       Future Appointments             In 1 week Netta Neat, Althea Charon, DO [facility], [facility]            Passed - Patient is not pregnant      Passed - Last BP in normal range    BP Readings from Last 1 Encounters:  06/30/20 134/79          Signed Prescriptions Disp Refills   amLODipine (NORVASC) 10 MG tablet 90 tablet 3    Sig: TAKE 1 TABLET(10 MG) BY MOUTH DAILY     Cardiovascular:  Calcium Channel Blockers Failed - 06/21/2021  7:09 AM      Failed - Valid encounter within last 6 months    Recent Outpatient Visits            11 months ago Annual physical exam   [facility], DO   1 year ago Annual physical exam   [facility], DO   2 years ago Essential hypertension   [facility], Althea Charon, DO       Future Appointments             In 1 week Netta Neat, Althea Charon, DO [facility]            Passed - Last BP in normal range    BP Readings from Last 1 Encounters:  06/30/20 134/79

## 2021-06-21 NOTE — Telephone Encounter (Signed)
Requested Prescriptions  Pending Prescriptions Disp Refills  . amLODipine (NORVASC) 10 MG tablet [Pharmacy Med Name: AMLODIPINE BESYLATE 10MG  TABLETS] 90 tablet 3    Sig: TAKE 1 TABLET(10 MG) BY MOUTH DAILY     Cardiovascular:  Calcium Channel Blockers Failed - 06/21/2021  7:09 AM      Failed - Valid encounter within last 6 months    Recent Outpatient Visits          11 months ago Annual physical exam   [facility], DO   1 year ago Annual physical exam   [facility], DO   2 years ago Essential hypertension   [facility], Althea Charon, DO      Future Appointments            In 1 week Netta Neat, Althea Charon, DO [facility], [facility]           Passed - Last BP in normal range    BP Readings from Last 1 Encounters:  06/30/20 134/79         . losartan (COZAAR) 100 MG tablet [Pharmacy Med Name: LOSARTAN 100MG  TABLETS] 90 tablet     Sig: TAKE 1 TABLET(100 MG) BY MOUTH DAILY     Cardiovascular:  Angiotensin Receptor Blockers Failed - 06/21/2021  7:09 AM      Failed - Cr in normal range and within 180 days    Creat  Date Value Ref Range Status  06/23/2020 0.93 0.70 - 1.25 mg/dL Final    Comment:    For patients >42 years of age, the reference limit for Creatinine is approximately 13% higher for people identified as African-American. .          Failed - K in normal range and within 180 days    Potassium  Date Value Ref Range Status  06/23/2020 4.6 3.5 - 5.3 mmol/L Final         Failed - Valid encounter within last 6 months    Recent Outpatient Visits          11 months ago Annual physical exam   [facility] 06/25/2020, DO   1 year ago Annual physical exam   [facility] Smitty Cords, DO   2 years ago Essential hypertension   [facility] Smitty Cords, [facility], DO       Future Appointments            In 1 week Althea Charon Netta Neat, DO [facility], [facility]           Passed - Patient is not pregnant      Passed - Last BP in normal range    BP Readings from Last 1 Encounters:  06/30/20 134/79

## 2021-06-25 ENCOUNTER — Other Ambulatory Visit: Payer: Medicare Other

## 2021-06-25 DIAGNOSIS — Z0000 Encounter for general adult medical examination without abnormal findings: Secondary | ICD-10-CM

## 2021-06-25 DIAGNOSIS — M19041 Primary osteoarthritis, right hand: Secondary | ICD-10-CM

## 2021-06-25 DIAGNOSIS — E669 Obesity, unspecified: Secondary | ICD-10-CM

## 2021-06-25 DIAGNOSIS — R351 Nocturia: Secondary | ICD-10-CM

## 2021-06-25 DIAGNOSIS — R7309 Other abnormal glucose: Secondary | ICD-10-CM

## 2021-06-25 DIAGNOSIS — E782 Mixed hyperlipidemia: Secondary | ICD-10-CM | POA: Diagnosis not present

## 2021-06-25 DIAGNOSIS — I10 Essential (primary) hypertension: Secondary | ICD-10-CM

## 2021-06-26 LAB — CBC WITH DIFFERENTIAL/PLATELET
Absolute Monocytes: 537 cells/uL (ref 200–950)
Basophils Absolute: 48 cells/uL (ref 0–200)
Basophils Relative: 0.7 %
Eosinophils Absolute: 272 cells/uL (ref 15–500)
Eosinophils Relative: 4 %
HCT: 49.5 % (ref 38.5–50.0)
Hemoglobin: 16.6 g/dL (ref 13.2–17.1)
Lymphs Abs: 2339 cells/uL (ref 850–3900)
MCH: 29.6 pg (ref 27.0–33.0)
MCHC: 33.5 g/dL (ref 32.0–36.0)
MCV: 88.4 fL (ref 80.0–100.0)
MPV: 9.6 fL (ref 7.5–12.5)
Monocytes Relative: 7.9 %
Neutro Abs: 3604 cells/uL (ref 1500–7800)
Neutrophils Relative %: 53 %
Platelets: 251 10*3/uL (ref 140–400)
RBC: 5.6 10*6/uL (ref 4.20–5.80)
RDW: 13 % (ref 11.0–15.0)
Total Lymphocyte: 34.4 %
WBC: 6.8 10*3/uL (ref 3.8–10.8)

## 2021-06-26 LAB — LIPID PANEL
Cholesterol: 219 mg/dL — ABNORMAL HIGH (ref <200)
HDL: 52 mg/dL (ref >=40)
LDL Cholesterol (Calc): 131 mg/dL (calc) — ABNORMAL HIGH
Non-HDL Cholesterol (Calc): 167 mg/dL (calc) — ABNORMAL HIGH (ref <130)
Total CHOL/HDL Ratio: 4.2 (calc) (ref <5.0)
Triglycerides: 219 mg/dL — ABNORMAL HIGH (ref <150)

## 2021-06-26 LAB — COMPLETE METABOLIC PANEL WITH GFR
AG Ratio: 2.2 (calc) (ref 1.0–2.5)
ALT: 86 U/L — ABNORMAL HIGH (ref 9–46)
AST: 40 U/L — ABNORMAL HIGH (ref 10–35)
Albumin: 4.6 g/dL (ref 3.6–5.1)
Alkaline phosphatase (APISO): 55 U/L (ref 35–144)
BUN: 20 mg/dL (ref 7–25)
CO2: 26 mmol/L (ref 20–32)
Calcium: 9.6 mg/dL (ref 8.6–10.3)
Chloride: 105 mmol/L (ref 98–110)
Creat: 0.89 mg/dL (ref 0.70–1.28)
Globulin: 2.1 g/dL (calc) (ref 1.9–3.7)
Glucose, Bld: 118 mg/dL (ref 65–139)
Potassium: 4.5 mmol/L (ref 3.5–5.3)
Sodium: 140 mmol/L (ref 135–146)
Total Bilirubin: 0.6 mg/dL (ref 0.2–1.2)
Total Protein: 6.7 g/dL (ref 6.1–8.1)
eGFR: 92 mL/min/{1.73_m2} (ref >=60)

## 2021-06-26 LAB — PSA: PSA: 0.78 ng/mL (ref <=4.00)

## 2021-06-26 LAB — HEMOGLOBIN A1C
Hgb A1c MFr Bld: 5.8 % of total Hgb — ABNORMAL HIGH (ref <5.7)
Mean Plasma Glucose: 120 mg/dL
eAG (mmol/L): 6.6 mmol/L

## 2021-07-02 ENCOUNTER — Encounter: Payer: Self-pay | Admitting: Family Medicine

## 2021-07-02 ENCOUNTER — Ambulatory Visit (INDEPENDENT_AMBULATORY_CARE_PROVIDER_SITE_OTHER): Payer: Medicare Other | Admitting: Family Medicine

## 2021-07-02 ENCOUNTER — Other Ambulatory Visit: Payer: Self-pay | Admitting: Family Medicine

## 2021-07-02 ENCOUNTER — Other Ambulatory Visit: Payer: Self-pay

## 2021-07-02 VITALS — BP 139/82 | HR 87 | Ht 69.0 in | Wt 259.2 lb

## 2021-07-02 DIAGNOSIS — M19041 Primary osteoarthritis, right hand: Secondary | ICD-10-CM | POA: Diagnosis not present

## 2021-07-02 DIAGNOSIS — R7309 Other abnormal glucose: Secondary | ICD-10-CM | POA: Diagnosis not present

## 2021-07-02 DIAGNOSIS — M19042 Primary osteoarthritis, left hand: Secondary | ICD-10-CM

## 2021-07-02 DIAGNOSIS — E782 Mixed hyperlipidemia: Secondary | ICD-10-CM | POA: Diagnosis not present

## 2021-07-02 DIAGNOSIS — M170 Bilateral primary osteoarthritis of knee: Secondary | ICD-10-CM | POA: Insufficient documentation

## 2021-07-02 DIAGNOSIS — Z0000 Encounter for general adult medical examination without abnormal findings: Secondary | ICD-10-CM

## 2021-07-02 DIAGNOSIS — I10 Essential (primary) hypertension: Secondary | ICD-10-CM | POA: Diagnosis not present

## 2021-07-02 DIAGNOSIS — Z23 Encounter for immunization: Secondary | ICD-10-CM

## 2021-07-02 MED ORDER — LOSARTAN POTASSIUM 100 MG PO TABS
100.0000 mg | ORAL_TABLET | Freq: Every day | ORAL | 3 refills | Status: DC
Start: 2021-07-02 — End: 2022-07-08

## 2021-07-02 MED ORDER — SHINGRIX 50 MCG/0.5ML IM SUSR: INTRAMUSCULAR | 1 refills | Status: DC

## 2021-07-02 MED ORDER — MELOXICAM 15 MG PO TABS: 15.0000 mg | ORAL_TABLET | Freq: Every day | ORAL | 2 refills | Status: DC | PRN

## 2021-07-02 NOTE — Progress Notes (Signed)
Subjective:    Patient ID: Cody Armstrong, male    DOB: 25-Mar-1951, 70 y.o.   MRN: [id number]  Sathvik Tiedt is a 70 y.o. male presenting on 07/02/2021 for Annual Exam and Knee Pain   HPI  Here for Annual Physical and Lab Review.   CHRONIC HTN: Reports no new concerns, continues on meds does well Current Meds - Losartan 184m daily, Amlodipine 171mdaily   Reports good compliance, took meds today. Tolerating well, w/o complaints. Lifestyle: - Diet: balanced, heavy on vegetables onions Denies CP, dyspnea, HA, edema, dizziness / lightheadedness   Osteoarthritis Multiple joints, hand/wrist/fingers Reports chronic history of repetitive overuse activity with hands. He has prior long history of working with hands and often tight grip, and says he has had wear and tear, previous dx arthritis, has used OTC treatments occasionally.  He takes Tylenol / Ibuprofen PRN with some good results Admits some nodules on hands, but no redness Admits stiffness and sore worse in AM Worse with winter weather. Plays golf weekly Has not tried Gabapentin.   HYPERLIPIDEMIA: Lipids show LDL 130 range. Not on statin therapy. He declines He tries to avoid some meats, beef/pork Prior history of Cardiology evaluation and had EKG / stress test, ruled out cardiac blockages.  Elevated ALT Prior history elevated ALT isolated improved  Additional complaint  Left Knee, medial pain Has some pain in both knees, some popping of joint, flares with inc activity He wears knee sleeve compression with improvement No recent or prior dx arthritis or x-ray    Health Maintenance: UTD Pneumonia vaccines done age [id number] Shingles shot at pharmacy after 07/12/21  Last colonoscopy, around 2014. Request record. Again - offered cologuard. 8 years have passed he declines.   Prostate CA Screening: Prior PSA / DRE reported normal. Last PSA 0.78 (06/2021) and previous 1.41. Currently asymptomatic. No known family history  of prostate CA.  [facility] 07/02/2021 06/30/2020 05/17/2019  Decreased Interest 0 0 0  Down, Depressed, Hopeless 0 0 0  PHQ - 2 Score 0 0 0  Altered sleeping 1 - -  Tired, decreased energy 1 - -  Change in appetite 0 - -  Feeling bad or failure about yourself  0 - -  Trouble concentrating 0 - -  Moving slowly or fidgety/restless 0 - -  Suicidal thoughts 0 - -  PHQ-9 Score 2 - -  Difficult doing work/chores Not difficult at all - -    Past Medical History:  Diagnosis Date   Allergy    Hypertension    Past Surgical History:  Procedure Laterality Date   CHOLECYSTECTOMY  05/1987   Social History   Socioeconomic History   Marital status: Married    Spouse name: Not on file   Number of children: Not on file   Years of education: High School   Highest education level: High school graduate  Occupational History   Not on file  Tobacco Use   Smoking status: Former    Packs/day: 1.00    Years: 1.00    Pack years: 1.00    Types: Cigarettes   Smokeless tobacco: Never   Tobacco comments:    only as a teen   [employer] laboratory technicianse: Never used  Substance and Sexual Activity   Alcohol use: Yes    Alcohol/week: 3.0 standard drinks    Types: 3 Standard drinks or equivalent per week   Drug use: Never   Sexual activity: Not on file  Other Topics Concern   Not on file  Social History Narrative   Not on file   Social Determinants of Health   Financial Resource Strain: Not on file  Food Insecurity: Not on file  Transportation Needs: Not on file  Physical Activity: Not on file  Stress: Not on file  Social Connections: Not on file  Intimate Partner Violence: Not on file   Family History  Problem Relation Age of Onset   Heart disease Mother    Diabetes Mother    Stroke Mother    Stroke Father    Current Outpatient Medications on File Prior to Visit  Medication Sig   amLODipine (NORVASC) 10 MG tablet TAKE 1 TABLET(10 MG) BY MOUTH DAILY   Omega-3 Fatty  Acids (FISH OIL) 1000 MG CAPS Take by mouth. (Patient not taking: Reported on 06/30/2020)   vitamin B-12 (CYANOCOBALAMIN) 100 MCG tablet Take 100 mcg by mouth daily. (Patient not taking: Reported on 06/30/2020)   No current facility-administered medications on file prior to visit.    Review of Systems  Constitutional:  Negative for activity change, appetite change, chills, diaphoresis, fatigue and fever.  HENT:  Negative for congestion and hearing loss.   Eyes:  Negative for visual disturbance.  Respiratory:  Negative for cough, chest tightness, shortness of breath and wheezing.   Cardiovascular:  Negative for chest pain, palpitations and leg swelling.  Gastrointestinal:  Negative for abdominal pain, constipation, diarrhea, nausea and vomiting.  Genitourinary:  Negative for dysuria, frequency and hematuria.  Musculoskeletal:  Negative for arthralgias and neck pain.  Skin:  Negative for rash.  Neurological:  Negative for dizziness, weakness, light-headedness, numbness and headaches.  Hematological:  Negative for adenopathy.  Psychiatric/Behavioral:  Negative for behavioral problems, dysphoric mood and sleep disturbance.   Per HPI unless specifically indicated above      Objective:    BP 139/82    Pulse 87    Ht 5' 9"  (1.753 m)    Wt 259 lb 3.2 oz (117.6 kg)    SpO2 97%    BMI 38.28 kg/m   Wt Readings from Last 3 Encounters:  07/02/21 259 lb 3.2 oz (117.6 kg)  06/30/20 257 lb 9.6 oz (116.8 kg)  06/27/19 262 lb (118.8 kg)    Physical Exam Vitals and nursing note reviewed.  Constitutional:      General: He is not in acute distress.    Appearance: He is well-developed. He is not diaphoretic.     Comments: Well-appearing, comfortable, cooperative  HENT:     Head: Normocephalic and atraumatic.  Eyes:     General:        Right eye: No discharge.        Left eye: No discharge.     Conjunctiva/sclera: Conjunctivae normal.     Pupils: Pupils are equal, round, and reactive to light.   Neck:     Thyroid: No thyromegaly.  Cardiovascular:     Rate and Rhythm: Normal rate and regular rhythm.     Pulses: Normal pulses.     Heart sounds: Normal heart sounds. No murmur heard. Pulmonary:     Effort: Pulmonary effort is normal. No respiratory distress.     Breath sounds: Normal breath sounds. No wheezing or rales.  Abdominal:     General: Bowel sounds are normal. There is no distension.     Palpations: Abdomen is soft. There is no mass.     Tenderness: There is no abdominal tenderness.  Musculoskeletal:  General: No tenderness. Normal range of motion.     Cervical back: Normal range of motion and neck supple.     Right lower leg: No edema.     Left lower leg: No edema.     Comments: Upper / Lower Extremities: - Normal muscle tone, strength bilateral upper extremities 5/5, lower extremities 5/5  Bilateral Knees Inspection: Normal appearance and symmetrical. No ecchymosis or effusion. Palpation: Mild +TTP Left knee only. Bilateral crepitus ROM: Full active ROM bilaterally Special Testing: Lachman / Valgus/Varus tests negative with intact ligaments (ACL, MCL, LCL). Strength: 5/5 intact knee flex/ext, ankle dorsi/plantarflex Neurovascular: distally intact sensation light touch and pulses  Lymphadenopathy:     Cervical: No cervical adenopathy.  Skin:    General: Skin is warm and dry.     Findings: No erythema or rash.  Neurological:     Mental Status: He is alert and oriented to person, place, and time.     Comments: Distal sensation intact to light touch all extremities  Psychiatric:        Mood and Affect: Mood normal.        Behavior: Behavior normal.        Thought Content: Thought content normal.     Comments: Well groomed, good eye contact, normal speech and thoughts   Results for orders placed or performed in visit on 06/25/21  PSA  Result Value Ref Range   PSA 0.78 < OR = 4.00 ng/mL  Lipid panel  Result Value Ref Range   Cholesterol 219 (H) <200  mg/dL   HDL 52 > OR = 40 mg/dL   Triglycerides 219 (H) <150 mg/dL   LDL Cholesterol (Calc) 131 (H) mg/dL (calc)   Total CHOL/HDL Ratio 4.2 <5.0 (calc)   Non-HDL Cholesterol (Calc) 167 (H) <130 mg/dL (calc)  COMPLETE METABOLIC PANEL WITH GFR  Result Value Ref Range   Glucose, Bld 118 65 - 139 mg/dL   BUN 20 7 - 25 mg/dL   Creat 0.89 0.70 - 1.28 mg/dL   eGFR 92 > OR = 60 mL/min/1.80m   BUN/Creatinine Ratio NOT APPLICABLE 6 - 22 (calc)   Sodium 140 135 - 146 mmol/L   Potassium 4.5 3.5 - 5.3 mmol/L   Chloride 105 98 - 110 mmol/L   CO2 26 20 - 32 mmol/L   Calcium 9.6 8.6 - 10.3 mg/dL   Total Protein 6.7 6.1 - 8.1 g/dL   Albumin 4.6 3.6 - 5.1 g/dL   Globulin 2.1 1.9 - 3.7 g/dL (calc)   AG Ratio 2.2 1.0 - 2.5 (calc)   Total Bilirubin 0.6 0.2 - 1.2 mg/dL   Alkaline phosphatase (APISO) 55 35 - 144 U/L   AST 40 (H) 10 - 35 U/L   ALT 86 (H) 9 - 46 U/L  CBC with Differential/Platelet  Result Value Ref Range   WBC 6.8 3.8 - 10.8 Thousand/uL   RBC 5.60 4.20 - 5.80 Million/uL   Hemoglobin 16.6 13.2 - 17.1 g/dL   HCT 49.5 38.5 - 50.0 %   MCV 88.4 80.0 - 100.0 fL   MCH 29.6 27.0 - 33.0 pg   MCHC 33.5 32.0 - 36.0 g/dL   RDW 13.0 11.0 - 15.0 %   Platelets 251 140 - 400 Thousand/uL   MPV 9.6 7.5 - 12.5 fL   Neutro Abs 3,604 1,500 - 7,800 cells/uL   Lymphs Abs 2,339 850 - 3,900 cells/uL   Absolute Monocytes 537 200 - 950 cells/uL   Eosinophils Absolute 272 15 -  500 cells/uL   Basophils Absolute 48 0 - 200 cells/uL   Neutrophils Relative % 53 %   Total Lymphocyte 34.4 %   Monocytes Relative 7.9 %   Eosinophils Relative 4.0 %   Basophils Relative 0.7 %  Hemoglobin A1c  Result Value Ref Range   Hgb A1c MFr Bld 5.8 (H) <5.7 % of total Hgb   Mean Plasma Glucose 120 mg/dL   eAG (mmol/L) 6.6 mmol/L      Assessment & Plan:   Problem List Items Addressed This Visit     Primary osteoarthritis of both knees   Primary osteoarthritis of both hands   Morbid obesity ([place])    Morbid obesity  BMI >38 with comorbid PreDiabetes, HTN, HLD Encourage wt loss lifestyle      Mixed hyperlipidemia    Elevated cholesterol LDL HDL on lifestyle, has elevated TG as well Last lipid panel 06/2021 The 10-year ASCVD risk score (Arnett DK, et al., 2019) is: 24.4%  Plan: 1. Offered ASCVD risk reduction primary with Statin therapy - he declined, despite counseling benefit risk 2. Encourage improved lifestyle - low carb/cholesterol, reduce portion size, continue improving regular exercise Yearly lipid      Essential hypertension    Controlled HTN - Home BP readings none  No known complications     Plan:  1. Continue current BP regimen - Losartan 170m daily, Amlodipine 154mdaily 2. Encourage improved lifestyle - low sodium diet, regular exercise 3. May monitor BP outside office      Elevated hemoglobin A1c    Mild elevated A1c to 5.8, at range of early PreDM now Concern with obesity, HTN, HLD  Plan:  1. Not on any therapy currently  2. Encourage improved lifestyle - low carb, low sugar diet, reduce portion size, continue improving regular exercise - diet handout given Yearly check       Other Visit Diagnoses     Annual physical exam    -  Primary   Need for shingles vaccine       Relevant Medications   SHINGRIX injection       Updated Health Maintenance information Recommend Colonoscopy in 2 years. Shingrix at pharmacy Reviewed recent lab results with patient Encouraged improvement to lifestyle with diet and exercise Goal of weight loss   Meds ordered this encounter  Medications   SHINGRIX injection    Sig: Inject 0.5 mL into muscle for shingles vaccine. Repeat dose in 2-6 months.    Dispense:  0.5 mL    Refill:  1      Follow up plan: Return in about 1 year (around 07/02/2022) for 1 year fasting lab only then 1 week later Annual Physical.  Future labs 1 year.  AlNobie PutnamDOPortlandedical Group 07/02/2021,  9:20 AM

## 2021-07-02 NOTE — Assessment & Plan Note (Signed)
Controlled HTN - Home BP readings none  No known complications     Plan:  1. Continue current BP regimen - Losartan 100mg daily, Amlodipine 10mg daily 2. Encourage improved lifestyle - low sodium diet, regular exercise 3. May monitor BP outside office 

## 2021-07-02 NOTE — Assessment & Plan Note (Signed)
Morbid obesity BMI >38 with comorbid PreDiabetes, HTN, HLD Encourage wt loss lifestyle

## 2021-07-02 NOTE — Patient Instructions (Addendum)
Thank you for coming to the office today.  PSA is normal.  Sugar is in the early pre diabetic range  Recent Labs    06/25/21 0802  HGBA1C 5.8*   Limit carbs starches sugars - can reduce, not avoid.  -------------------------------  Try to limit processed meats to lower cholesterol  Reconsider Rx Statin cholesterol med.  ----------------------------  Info on future COVID issue for you or someone else.  The pharmacist will discuss the criteria and review symptoms and if meets criteria, they can prescribe it for them.  [facility] Ph: [phone] [address], [place], [place] [address]  DUE for FASTING BLOOD WORK (no food or drink after midnight before the lab appointment, only water or coffee without cream/sugar on the morning of)  SCHEDULE "Lab Only" visit in the morning at the clinic for lab draw in 1 YEAR  - Make sure Lab Only appointment is at about 1 week before your next appointment, so that results will be available  For Lab Results, once available within 2-3 days of blood draw, you can can log in to MyChart online to view your results and a brief explanation. Also, we can discuss results at next follow-up visit.    Please schedule a Follow-up Appointment to: Return in about 1 year (around 07/02/2022) for 1 year fasting lab only then 1 week later Annual Physical.  If you have any other questions or concerns, please feel free to call the office or send a message through MyChart. You may also schedule an earlier appointment if necessary.  Additionally, you may be receiving a survey about your experience at our office within a few days to 1 week by e-mail or mail. We value your feedback.  Saralyn Pilar, DO [facility], [place]

## 2021-07-02 NOTE — Assessment & Plan Note (Signed)
Elevated cholesterol LDL HDL on lifestyle, has elevated TG as well Last lipid panel 06/2021 The 10-year ASCVD risk score (Arnett DK, et al., 2019) is: 24.4%  Plan: 1. Offered ASCVD risk reduction primary with Statin therapy - he declined, despite counseling benefit risk 2. Encourage improved lifestyle - low carb/cholesterol, reduce portion size, continue improving regular exercise Yearly lipid

## 2021-07-02 NOTE — Assessment & Plan Note (Signed)
Mild elevated A1c to 5.8, at range of early PreDM now Concern with obesity, HTN, HLD  Plan:  1. Not on any therapy currently  2. Encourage improved lifestyle - low carb, low sugar diet, reduce portion size, continue improving regular exercise - diet handout given Yearly check

## 2021-07-14 ENCOUNTER — Other Ambulatory Visit: Payer: Self-pay

## 2021-07-14 ENCOUNTER — Ambulatory Visit
Admission: RE | Admit: 2021-07-14 | Discharge: 2021-07-14 | Disposition: A | Discharge status: Home / Self Care | Payer: Medicare Other | Attending: Family Medicine | Admitting: Family Medicine

## 2021-07-14 ENCOUNTER — Other Ambulatory Visit: Payer: Self-pay | Admitting: Family Medicine

## 2021-07-14 ENCOUNTER — Ambulatory Visit
Admission: RE | Admit: 2021-07-14 | Discharge: 2021-07-14 | Disposition: A | Discharge status: Home / Self Care | Payer: Medicare Other | Source: Ambulatory Visit | Attending: Family Medicine | Admitting: Family Medicine

## 2021-07-14 DIAGNOSIS — M25561 Pain in right knee: Secondary | ICD-10-CM | POA: Insufficient documentation

## 2021-07-14 DIAGNOSIS — G8929 Other chronic pain: Secondary | ICD-10-CM

## 2021-07-14 DIAGNOSIS — M170 Bilateral primary osteoarthritis of knee: Secondary | ICD-10-CM

## 2021-07-14 DIAGNOSIS — M1712 Unilateral primary osteoarthritis, left knee: Secondary | ICD-10-CM | POA: Diagnosis not present

## 2021-07-14 DIAGNOSIS — M25562 Pain in left knee: Secondary | ICD-10-CM | POA: Diagnosis not present

## 2021-07-14 DIAGNOSIS — M1711 Unilateral primary osteoarthritis, right knee: Secondary | ICD-10-CM | POA: Diagnosis not present

## 2021-07-15 ENCOUNTER — Encounter: Payer: Self-pay | Admitting: Family Medicine

## 2021-07-17 ENCOUNTER — Ambulatory Visit: Payer: Medicare Other | Admitting: Family Medicine

## 2021-07-20 ENCOUNTER — Ambulatory Visit: Payer: Medicare Other | Admitting: Family Medicine

## 2021-09-18 ENCOUNTER — Other Ambulatory Visit: Payer: Self-pay | Admitting: Family Medicine

## 2021-09-18 ENCOUNTER — Other Ambulatory Visit: Payer: Self-pay

## 2021-09-18 ENCOUNTER — Encounter: Payer: Self-pay | Admitting: Family Medicine

## 2021-09-18 ENCOUNTER — Ambulatory Visit (INDEPENDENT_AMBULATORY_CARE_PROVIDER_SITE_OTHER): Payer: Medicare Other | Admitting: Family Medicine

## 2021-09-18 VITALS — BP 145/86 | HR 77 | Ht 69.0 in | Wt 253.0 lb

## 2021-09-18 DIAGNOSIS — A0811 Acute gastroenteropathy due to Norwalk agent: Secondary | ICD-10-CM

## 2021-09-18 DIAGNOSIS — H1033 Unspecified acute conjunctivitis, bilateral: Secondary | ICD-10-CM

## 2021-09-18 DIAGNOSIS — R197 Diarrhea, unspecified: Secondary | ICD-10-CM

## 2021-09-18 MED ORDER — POLYMYXIN B-TRIMETHOPRIM 10000-0.1 UNIT/ML-% OP SOLN: 1.0000 [drp] | Freq: Four times a day (QID) | OPHTHALMIC | 2 refills | Status: AC

## 2021-09-18 MED ORDER — POLYMYXIN B-TRIMETHOPRIM 10000-0.1 UNIT/ML-% OP SOLN: 1.0000 [drp] | Freq: Four times a day (QID) | OPHTHALMIC | 2 refills | Status: DC

## 2021-09-18 NOTE — Progress Notes (Signed)
? ?Subjective:  ? ? Patient ID: Cody Armstrong, male    DOB: April 06, 1951, 71 y.o.   MRN: [id number] ? ?Cody Armstrong is a 71 y.o. male presenting on 09/18/2021 for Diarrhea, Emesis, and GI Problem ? ? ?HPI ? ?Suspected Norovirus ?Diarrheal illness ?Reports symptoms onset Sunday, now has had gradual improvement in past few days. ?He said he was drinking ice water regularly. ?He had nausea vomiting that has now resolved. ?Improved diarrhea, had BM earlier more solid, no episodes today ?Admits sinusitis, congestion, eye crusting and drainage. ? ? ?Depression screen [facility] 2/9 07/02/2021 06/30/2020 05/17/2019  ?Decreased Interest 0 0 0  ?Down, Depressed, Hopeless 0 0 0  ?PHQ - 2 Score 0 0 0  ?Altered sleeping 1 - -  ?Tired, decreased energy 1 - -  ?Change in appetite 0 - -  ?Feeling bad or failure about yourself  0 - -  ?Trouble concentrating 0 - -  ?Moving slowly or fidgety/restless 0 - -  ?Suicidal thoughts 0 - -  ?PHQ-9 Score 2 - -  ?Difficult doing work/chores Not difficult at all - -  ? ? ?Social History  ? ?Tobacco Use  ? Smoking status: Former  ?  Packs/day: 1.00  ?  Years: 1.00  ?  Pack years: 1.00  ?  Types: Cigarettes  ? Smokeless tobacco: Never  ? Tobacco comments:  ?  only as a teen   ?Vaping Use  ? Vaping Use: Never used  ?Substance Use Topics  ? Alcohol use: Yes  ?  Alcohol/week: 3.0 standard drinks  ?  Types: 3 Standard drinks or equivalent per week  ? Drug use: Never  ? ? ?Review of Systems ?Per HPI unless specifically indicated above ? ?   ?Objective:  ?  ?BP (!) 145/86   Pulse 77   Ht 5' 9"  (1.753 m)   Wt 253 lb (114.8 kg)   SpO2 99%   BMI 37.36 kg/m?   ?Wt Readings from Last 3 Encounters:  ?09/18/21 253 lb (114.8 kg)  ?07/02/21 259 lb 3.2 oz (117.6 kg)  ?06/30/20 257 lb 9.6 oz (116.8 kg)  ?  ?Physical Exam ?Vitals and nursing note reviewed.  ?Constitutional:   ?   General: He is not in acute distress. ?   Appearance: Normal appearance. He is well-developed. He is not diaphoretic.  ?   Comments:  Well-appearing, comfortable, cooperative  ?HENT:  ?   Head: Normocephalic and atraumatic.  ?Eyes:  ?   General:     ?   Right eye: No discharge.     ?   Left eye: No discharge.  ?   Comments: Mild bilateral conjunctival injection, no scleral icterus  ?Cardiovascular:  ?   Rate and Rhythm: Normal rate.  ?Pulmonary:  ?   Effort: Pulmonary effort is normal.  ?Skin: ?   General: Skin is warm and dry.  ?   Findings: No erythema or rash.  ?Neurological:  ?   Mental Status: He is alert and oriented to person, place, and time.  ?Psychiatric:     ?   Mood and Affect: Mood normal.     ?   Behavior: Behavior normal.     ?   Thought Content: Thought content normal.  ?   Comments: Well groomed, good eye contact, normal speech and thoughts  ? ?Results for orders placed or performed in visit on 06/25/21  ?PSA  ?Result Value Ref Range  ? PSA 0.78 < OR = 4.00 ng/mL  ?Lipid panel  ?  Result Value Ref Range  ? Cholesterol 219 (H) <200 mg/dL  ? HDL 52 > OR = 40 mg/dL  ? Triglycerides 219 (H) <150 mg/dL  ? LDL Cholesterol (Calc) 131 (H) mg/dL (calc)  ? Total CHOL/HDL Ratio 4.2 <5.0 (calc)  ? Non-HDL Cholesterol (Calc) 167 (H) <130 mg/dL (calc)  ?COMPLETE METABOLIC PANEL WITH GFR  ?Result Value Ref Range  ? Glucose, Bld 118 65 - 139 mg/dL  ? BUN 20 7 - 25 mg/dL  ? Creat 0.89 0.70 - 1.28 mg/dL  ? eGFR 92 > OR = 60 mL/min/1.67m  ? BUN/Creatinine Ratio NOT APPLICABLE 6 - 22 (calc)  ? Sodium 140 135 - 146 mmol/L  ? Potassium 4.5 3.5 - 5.3 mmol/L  ? Chloride 105 98 - 110 mmol/L  ? CO2 26 20 - 32 mmol/L  ? Calcium 9.6 8.6 - 10.3 mg/dL  ? Total Protein 6.7 6.1 - 8.1 g/dL  ? Albumin 4.6 3.6 - 5.1 g/dL  ? Globulin 2.1 1.9 - 3.7 g/dL (calc)  ? AG Ratio 2.2 1.0 - 2.5 (calc)  ? Total Bilirubin 0.6 0.2 - 1.2 mg/dL  ? Alkaline phosphatase (APISO) 55 35 - 144 U/L  ? AST 40 (H) 10 - 35 U/L  ? ALT 86 (H) 9 - 46 U/L  ?CBC with Differential/Platelet  ?Result Value Ref Range  ? WBC 6.8 3.8 - 10.8 Thousand/uL  ? RBC 5.60 4.20 - 5.80 Million/uL  ? Hemoglobin  16.6 13.2 - 17.1 g/dL  ? HCT 49.5 38.5 - 50.0 %  ? MCV 88.4 80.0 - 100.0 fL  ? MCH 29.6 27.0 - 33.0 pg  ? MCHC 33.5 32.0 - 36.0 g/dL  ? RDW 13.0 11.0 - 15.0 %  ? Platelets 251 140 - 400 Thousand/uL  ? MPV 9.6 7.5 - 12.5 fL  ? Neutro Abs 3,604 1,500 - 7,800 cells/uL  ? Lymphs Abs 2,339 850 - 3,900 cells/uL  ? Absolute Monocytes 537 200 - 950 cells/uL  ? Eosinophils Absolute 272 15 - 500 cells/uL  ? Basophils Absolute 48 0 - 200 cells/uL  ? Neutrophils Relative % 53 %  ? Total Lymphocyte 34.4 %  ? Monocytes Relative 7.9 %  ? Eosinophils Relative 4.0 %  ? Basophils Relative 0.7 %  ?Hemoglobin A1c  ?Result Value Ref Range  ? Hgb A1c MFr Bld 5.8 (H) <5.7 % of total Hgb  ? Mean Plasma Glucose 120 mg/dL  ? eAG (mmol/L) 6.6 mmol/L  ? ?   ?Assessment & Plan:  ? ?Problem List Items Addressed This Visit   ?None ?Visit Diagnoses   ? ? Norovirus    -  Primary  ? Diarrhea, unspecified type      ? Acute conjunctivitis of both eyes, unspecified acute conjunctivitis type      ? ?  ?  ?Norovirus - improving ?Diarrhea ?Improved overall seems to be resolving now about 1 week onset now ?No further nausea vomiting - declines Zofran ?May use imodium PRN ?Supportive care, re hydration options reviewed. ?Return criteria as needed ? ?Conjunctivitis ?Early mixed symptoms, could be viral, will agree to order antibiotic eye drops polytrim w instructions, warm compresses PRN ?Note out of stock at first pharmacy, already re ordered to different pharmacy. ? ?Meds ordered this encounter  ?Medications  ? DISCONTD: trimethoprim-polymyxin b (POLYTRIM) ophthalmic solution  ?  Sig: Place 1 drop into both eyes every 6 (six) hours. Up to 4 drops per day.  ?  Dispense:  10 mL  ?  Refill:  2  ? ? ? ? ?Follow up plan: ?Return if symptoms worsen or fail to improve. ? ? ?Nobie Putnam, DO ?[facility] ?[facility] ?09/18/2021, 9:44 AM ?

## 2021-09-18 NOTE — Telephone Encounter (Signed)
Sending to different pharmacy d/t out of stock ? ?Requested Prescriptions  ?Pending Prescriptions Disp Refills  ?? trimethoprim-polymyxin b (POLYTRIM) ophthalmic solution 10 mL 2  ?  Sig: Place 1 drop into both eyes every 6 (six) hours. Up to 4 drops per day.  ?  ? Off-Protocol Failed - 09/18/2021  1:33 PM  ?  ?  Failed - Medication not assigned to a protocol, review manually.  ?  ?  Passed - Valid encounter within last 12 months  ?  Recent Outpatient Visits   ?      ? Today Norovirus  ? [facility] Althea Charon, Netta Neat, DO  ? 2 months ago Annual physical exam  ? [facility] Smitty Cords, DO  ? 1 year ago Annual physical exam  ? [facility] Smitty Cords, DO  ? 2 years ago Annual physical exam  ? [facility] Smitty Cords, DO  ? 2 years ago Essential hypertension  ? [facility] Smitty Cords, DO  ?  ?  ?Future Appointments   ?        ? In 9 months Althea Charon, Netta Neat, DO [facility]  ?  ? ?  ?  ?  ? ? ?

## 2021-09-18 NOTE — Telephone Encounter (Signed)
Pt called and stated that the pharmacy didn't receive an RX / I spoke with the [facility] and they stated they are out of stock and dont have any trimethoprim-polymyxin b (POLYTRIM) ophthalmic solution/ pt asked if the office can try and send this to  ?[id number] - [place], [place] - [address] Phone:  [phone]  ?Fax:  [phone]  ?  ?And hopefully they have it / please advise  ?

## 2021-09-18 NOTE — Patient Instructions (Addendum)
Thank you for coming to the office today. ? ?Keep improving hydration, may try more gatorade G2 low calorie ? ?Take probiotic pills ? ?Start antibiotic eye drops 1 drop in each eye every 6 hours or 4 times a day for 7-10 days ? ?- Initial treatment is warm compresses up to 4 to 6 times a day using warm washcloth place over eyelid and apply gentle pressure, hold this on for 10-15 min at a time, can re-warm if cools down ? ?It may drain pus and reduce in size, this is ideal, otherwise if significant worsening with spreading of redness or swelling onto eyelid or around face, unable to see, fevers/chills, then please return to clinic sooner or call, may go to Emergency Dept if significant worsening ? ?Otherwise, if improving but not going away after 1-2 weeks, call us back and we will refer you to Ophthalmology to have it removed. ? ? ? ?Please schedule a Follow-up Appointment to: Return if symptoms worsen or fail to improve. ? ?If you have any other questions or concerns, please feel free to call the office or send a message through MyChart. You may also schedule an earlier appointment if necessary. ? ?Additionally, you may be receiving a survey about your experience at our office within a few days to 1 week by e-mail or mail. We value your feedback. ? ?Saralyn Pilar, DO ?[facility], [place] ?

## 2021-09-23 ENCOUNTER — Telehealth: Payer: Self-pay

## 2021-09-23 DIAGNOSIS — J0110 Acute frontal sinusitis, unspecified: Secondary | ICD-10-CM

## 2021-09-23 MED ORDER — PREDNISONE 10 MG PO TABS
ORAL_TABLET | ORAL | 0 refills | Status: DC
Start: 2021-09-23 — End: 2022-07-08

## 2021-09-23 NOTE — Telephone Encounter (Signed)
Okay [facility] ? ?Saralyn Pilar, DO ?[facility] ?[facility] ?09/23/2021, 3:07 PM ? ?

## 2021-09-23 NOTE — Telephone Encounter (Signed)
Pt declined scheduling his medicare wellness visit. He stated, that he is not interested in scheduling a medicare wellness visit. ?

## 2021-09-23 NOTE — Telephone Encounter (Signed)
I called the patient to attempt to schedule him for his medicare wellness visit. He declined scheduling the appt.  The pt complains of persistent watery, crusty drainage, itching, redness and irritation from both eyes. He also complains of post nasal drainage. He denies any other respiratory issues. He was diagnose with bilateral conjunctivitis x 5 days ago. He said he is currently using  the Polytrim eye drops as prescribed  and Allegra with no improvement. He is requesting that you call him in prednisone for his symptoms. He said he had something like this to happen over 5 yrs ago and the only thing that helped was prednisone ? ?

## 2021-09-23 NOTE — Telephone Encounter (Signed)
Left message for patient to call back and schedule the Medicare Annual Wellness Visit (AWV) virtually, telephone or face to face. ? [phone] ?Lindsay Soulliere, CMA ?

## 2021-09-24 DIAGNOSIS — H04123 Dry eye syndrome of bilateral lacrimal glands: Secondary | ICD-10-CM | POA: Diagnosis not present

## 2021-09-24 DIAGNOSIS — H1045 Other chronic allergic conjunctivitis: Secondary | ICD-10-CM | POA: Diagnosis not present

## 2021-09-24 DIAGNOSIS — H2513 Age-related nuclear cataract, bilateral: Secondary | ICD-10-CM | POA: Diagnosis not present

## 2021-11-04 ENCOUNTER — Telehealth: Payer: Self-pay

## 2021-11-04 NOTE — Telephone Encounter (Signed)
Attempted to schedule the patient for his medicare wellness visit. The patient declined the appt.  ?

## 2022-04-26 DIAGNOSIS — H1045 Other chronic allergic conjunctivitis: Secondary | ICD-10-CM | POA: Diagnosis not present

## 2022-05-10 DIAGNOSIS — H1045 Other chronic allergic conjunctivitis: Secondary | ICD-10-CM | POA: Diagnosis not present

## 2022-06-16 ENCOUNTER — Other Ambulatory Visit: Payer: Self-pay | Admitting: Family Medicine

## 2022-06-16 DIAGNOSIS — I10 Essential (primary) hypertension: Secondary | ICD-10-CM

## 2022-06-16 NOTE — Telephone Encounter (Signed)
Requested Prescriptions  Pending Prescriptions Disp Refills   amLODipine (NORVASC) 10 MG tablet [Pharmacy Med Name: AMLODIPINE BESYLATE 10MG  TABLETS] 90 tablet 0    Sig: TAKE 1 TABLET(10 MG) BY MOUTH DAILY     Cardiovascular: Calcium Channel Blockers 2 Failed - 06/16/2022  7:10 AM      Failed - Last BP in normal range    BP Readings from Last 1 Encounters:  09/18/21 (!) 145/86         Failed - Valid encounter within last 6 months    Recent Outpatient Visits           9 months ago Norovirus   [facility], DO   11 months ago Annual physical exam   [facility], Althea Charon, DO   1 year ago Annual physical exam   [facility], DO   2 years ago Annual physical exam   [facility], DO   3 years ago Essential hypertension   [facility], Althea Charon, DO       Future Appointments             In 3 weeks Netta Neat, Althea Charon, DO [facility]            Passed - Last Heart Rate in normal range    Pulse Readings from Last 1 Encounters:  09/18/21 77

## 2022-06-30 ENCOUNTER — Other Ambulatory Visit: Payer: Self-pay

## 2022-06-30 DIAGNOSIS — Z125 Encounter for screening for malignant neoplasm of prostate: Secondary | ICD-10-CM

## 2022-06-30 DIAGNOSIS — E669 Obesity, unspecified: Secondary | ICD-10-CM

## 2022-06-30 DIAGNOSIS — I10 Essential (primary) hypertension: Secondary | ICD-10-CM

## 2022-06-30 DIAGNOSIS — E782 Mixed hyperlipidemia: Secondary | ICD-10-CM

## 2022-06-30 DIAGNOSIS — Z0000 Encounter for general adult medical examination without abnormal findings: Secondary | ICD-10-CM

## 2022-06-30 DIAGNOSIS — R7309 Other abnormal glucose: Secondary | ICD-10-CM

## 2022-07-01 ENCOUNTER — Other Ambulatory Visit: Payer: Medicare Other

## 2022-07-01 DIAGNOSIS — I10 Essential (primary) hypertension: Secondary | ICD-10-CM | POA: Diagnosis not present

## 2022-07-01 DIAGNOSIS — R7309 Other abnormal glucose: Secondary | ICD-10-CM | POA: Diagnosis not present

## 2022-07-01 DIAGNOSIS — E782 Mixed hyperlipidemia: Secondary | ICD-10-CM | POA: Diagnosis not present

## 2022-07-02 LAB — COMPREHENSIVE METABOLIC PANEL
AG Ratio: 2.2 (calc) (ref 1.0–2.5)
ALT: 99 U/L — ABNORMAL HIGH (ref 9–46)
AST: 49 U/L — ABNORMAL HIGH (ref 10–35)
Albumin: 4.9 g/dL (ref 3.6–5.1)
Alkaline phosphatase (APISO): 56 U/L (ref 35–144)
BUN: 16 mg/dL (ref 7–25)
CO2: 28 mmol/L (ref 20–32)
Calcium: 9.7 mg/dL (ref 8.6–10.3)
Chloride: 104 mmol/L (ref 98–110)
Creat: 0.88 mg/dL (ref 0.70–1.28)
Globulin: 2.2 g/dL (calc) (ref 1.9–3.7)
Glucose, Bld: 121 mg/dL — ABNORMAL HIGH (ref 65–99)
Potassium: 4.5 mmol/L (ref 3.5–5.3)
Sodium: 141 mmol/L (ref 135–146)
Total Bilirubin: 0.6 mg/dL (ref 0.2–1.2)
Total Protein: 7.1 g/dL (ref 6.1–8.1)

## 2022-07-02 LAB — CBC WITH DIFFERENTIAL/PLATELET
Absolute Monocytes: 496 cells/uL (ref 200–950)
Basophils Absolute: 50 cells/uL (ref 0–200)
Basophils Relative: 0.8 %
Eosinophils Absolute: 217 cells/uL (ref 15–500)
Eosinophils Relative: 3.5 %
HCT: 48.2 % (ref 38.5–50.0)
Hemoglobin: 16.7 g/dL (ref 13.2–17.1)
Lymphs Abs: 1823 cells/uL (ref 850–3900)
MCH: 30 pg (ref 27.0–33.0)
MCHC: 34.6 g/dL (ref 32.0–36.0)
MCV: 86.7 fL (ref 80.0–100.0)
MPV: 9.6 fL (ref 7.5–12.5)
Monocytes Relative: 8 %
Neutro Abs: 3615 cells/uL (ref 1500–7800)
Neutrophils Relative %: 58.3 %
Platelets: 256 10*3/uL (ref 140–400)
RBC: 5.56 10*6/uL (ref 4.20–5.80)
RDW: 13.4 % (ref 11.0–15.0)
Total Lymphocyte: 29.4 %
WBC: 6.2 10*3/uL (ref 3.8–10.8)

## 2022-07-02 LAB — LIPID PANEL
Cholesterol: 209 mg/dL — ABNORMAL HIGH (ref <200)
HDL: 53 mg/dL (ref >=40)
LDL Cholesterol (Calc): 118 mg/dL (calc) — ABNORMAL HIGH
Non-HDL Cholesterol (Calc): 156 mg/dL (calc) — ABNORMAL HIGH (ref <130)
Total CHOL/HDL Ratio: 3.9 (calc) (ref <5.0)
Triglycerides: 266 mg/dL — ABNORMAL HIGH (ref <150)

## 2022-07-02 LAB — HEMOGLOBIN A1C
Hgb A1c MFr Bld: 6 % of total Hgb — ABNORMAL HIGH (ref <5.7)
Mean Plasma Glucose: 126 mg/dL
eAG (mmol/L): 7 mmol/L

## 2022-07-02 LAB — PSA: PSA: 0.86 ng/mL (ref <=4.00)

## 2022-07-08 ENCOUNTER — Encounter: Payer: Self-pay | Admitting: Family Medicine

## 2022-07-08 ENCOUNTER — Other Ambulatory Visit: Payer: Self-pay | Admitting: Family Medicine

## 2022-07-08 ENCOUNTER — Ambulatory Visit (INDEPENDENT_AMBULATORY_CARE_PROVIDER_SITE_OTHER): Payer: Medicare Other | Admitting: Family Medicine

## 2022-07-08 VITALS — BP 125/72 | HR 86 | Temp 98.2°F | Resp 18 | Ht 67.5 in | Wt 263.6 lb

## 2022-07-08 DIAGNOSIS — E782 Mixed hyperlipidemia: Secondary | ICD-10-CM

## 2022-07-08 DIAGNOSIS — I10 Essential (primary) hypertension: Secondary | ICD-10-CM | POA: Diagnosis not present

## 2022-07-08 DIAGNOSIS — Z0000 Encounter for general adult medical examination without abnormal findings: Secondary | ICD-10-CM | POA: Diagnosis not present

## 2022-07-08 DIAGNOSIS — R7309 Other abnormal glucose: Secondary | ICD-10-CM

## 2022-07-08 DIAGNOSIS — Z125 Encounter for screening for malignant neoplasm of prostate: Secondary | ICD-10-CM

## 2022-07-08 MED ORDER — LOSARTAN POTASSIUM 100 MG PO TABS: 100.0000 mg | ORAL_TABLET | Freq: Every day | ORAL | 3 refills | Status: DC

## 2022-07-08 MED ORDER — AMLODIPINE BESYLATE 10 MG PO TABS: 10.0000 mg | ORAL_TABLET | Freq: Every day | ORAL | 3 refills | Status: DC

## 2022-07-08 NOTE — Assessment & Plan Note (Signed)
Mild elevated A1c to 6.0 Slight inc risk in PreDM Concern with obesity, HTN, HLD  Plan:  1. Not on any therapy currently  2. Encourage improved lifestyle - low carb, low sugar diet, reduce portion size, continue improving regular exercise - diet handout given Yearly check

## 2022-07-08 NOTE — Assessment & Plan Note (Signed)
Improved LDL still elevated TG Last lipid panel 06/2022 The 10-year ASCVD risk score (Arnett DK, et al., 2019) is: 21.4%  Plan: 1. Offered ASCVD risk reduction primary with Statin therapy - he declined, despite counseling benefit risk 2. Encourage improved lifestyle - low carb/cholesterol, reduce portion size, continue improving regular exercise Yearly lipid

## 2022-07-08 NOTE — Assessment & Plan Note (Addendum)
Morbid obesity BMI >40 with comorbid PreDiabetes, HTN, HLD Encourage wt loss lifestyle Discussion on weight management options lifestyle vs medication, consider GLP1 / Contrave, he declines at this time, notify me if change mind, seems limited coverage on GLP1

## 2022-07-08 NOTE — Patient Instructions (Addendum)
Thank you for coming to the office today.  REfilled the medications for 1 year  Improved cholesterol panel LDL from 131 down to 118 Keep up the great work  Recent Labs    07/01/22 0810  HGBA1C 6.0*    Call insurance find cost and coverage of the following - check the following: - Drug Tier, Preferred List, On Formulary - All will require a "Prior Authorization" from [place] first, before you can find out the cost - Find out if there is "Step Therapy" (other medicines required before you can try these)  Once you pick the one you want to try, let me know - we can get a sample ready IF we have it in stock. Then try it - and before running out of medicine, contact me back to order your Rx so we have time to get it processed.  For Weight Loss / Obesity only  Zepbound (same as Mounjaro) weekly injection - this is the newest one available, top result.  ZOXWRU (same as Ozempic) weekly injection - start 0.25mg  weekly, 1 dose per pen, single use, auto-injector  Saxenda - DAILY injection - start 0.6mg  injection DAILY, you can increase the dose by 1 notch or 0.6 mg per week, if you don't tolerate a dose, can reduce it the next day.  Contrave - oral medication, appetite suppression has wellbutrin/bupropion and naltrexone in it and it can also help with appetite, it is ordered through a speciality pharmacy. - $99 a month through a mail order discount.   Future make sure your insurance has weight loss coverage  DUE for FASTING BLOOD WORK (no food or drink after midnight before the lab appointment, only water or coffee without cream/sugar on the morning of)  SCHEDULE "Lab Only" visit in the morning at the clinic for lab draw in 1 YEAR  - Make sure Lab Only appointment is at about 1 week before your next appointment, so that results will be available  For Lab Results, once available within 2-3 days of blood draw, you can can log in to MyChart online to view your results and a brief explanation.  Also, we can discuss results at next follow-up visit.    Please schedule a Follow-up Appointment to: Return in about 1 year (around 07/09/2023) for 1 year fasting lab only then 1 week later Annual Physical.  If you have any other questions or concerns, please feel free to call the office or send a message through MyChart. You may also schedule an earlier appointment if necessary.  Additionally, you may be receiving a survey about your experience at our office within a few days to 1 week by e-mail or mail. We value your feedback.  Saralyn Pilar, DO [facility] [place], [place]

## 2022-07-08 NOTE — Assessment & Plan Note (Signed)
Controlled HTN - Home BP readings none  No known complications     Plan:  1. Continue current BP regimen - Losartan 100mg  daily, Amlodipine 10mg  daily 2. Encourage improved lifestyle - low sodium diet, regular exercise 3. May monitor BP outside office

## 2022-07-08 NOTE — Progress Notes (Signed)
Subjective:    Patient ID: Cody Armstrong, male    DOB: [birth year], 71 y.o.   MRN: [id number]  Cody Armstrong is a 71 y.o. male presenting on 07/08/2022 for Annual Exam (Want discuss weight management )   HPI  Here for Annual Physical and Lab Review  Morbid Obesity BMI 40 Weight up 263 lbs from 253 lbs  CHRONIC HTN: Reports no new concerns, continues on meds does well Current Meds - Losartan 100mg  daily, Amlodipine 10mg  daily   Reports good compliance, took meds today. Tolerating well, w/o complaints. Lifestyle: - Diet: balanced, heavy on vegetables onions Denies CP, dyspnea, HA, edema, dizziness / lightheadedness   Osteoarthritis Multiple joints, hand/wrist/fingers Reports chronic history of repetitive overuse activity with hands. He has prior long history of working with hands and often tight grip, and says he has had wear and tear, previous dx arthritis, has used OTC treatments occasionally.  He takes Tylenol / Ibuprofen PRN with some good results Admits some nodules on hands, but no redness Admits stiffness and sore worse in AM Worse with winter weather. Plays golf weekly Has not tried Gabapentin.   HYPERLIPIDEMIA: Lipid panel improved from 131 down to 118. Total is around 200 Not on statin therapy. He declines He tries to avoid some meats, beef/pork Prior history of Cardiology evaluation and had EKG / stress test, ruled out cardiac blockages.   Elevated LFTs AST 49 and ALT 99, slight increase.  GERD Controlled on OTC Omeprazole 20mg  daily.  Pre Diabetes Last lab A1c 6.0, prior range 5.7 to 5.8 He admits reduced carbs Limited exercise.   Additional complaint   Left Knee, medial pain Has some pain in both knees, some popping of joint, flares with inc activity He wears knee sleeve compression with improvement No recent or prior dx arthritis or x-ray    Health Maintenance: UTD Pneumonia vaccines done age 71, 41.   Shingles shot at pharmacy after 07/12/21    Last colonoscopy, around 2014. Request record. Again - offered cologuard. for 2024   Prostate CA Screening: Prior PSA / DRE reported normal. Last PSA 0.86 (06/2022) - prior lab 0.78 (06/2021) and previous 1.41. Currently asymptomatic. No known family history of prostate CA.     07/08/2022    9:00 AM 07/08/2022    8:59 AM 07/02/2021    9:06 AM  Depression screen PHQ 2/9  Decreased Interest 0 0 0  Down, Depressed, Hopeless 0 0 0  PHQ - 2 Score 0 0 0  Altered sleeping 0  1  Tired, decreased energy 0  1  Change in appetite 0  0  Feeling bad or failure about yourself  0  0  Trouble concentrating 0  0  Moving slowly or fidgety/restless 0  0  Suicidal thoughts 0  0  PHQ-9 Score 0  2  Difficult doing work/chores Not difficult at all  Not difficult at all    Past Medical History:  Diagnosis Date   Allergy    Hypertension    Past Surgical History:  Procedure Laterality Date   CHOLECYSTECTOMY  05/1987   Social History   Socioeconomic History   Marital status: Married    Spouse name: Not on file   Number of children: Not on file   Years of education: High School   Highest education level: High school graduate  Occupational History   Not on file  Tobacco Use   Smoking status: Former    Packs/day: 1.00    Years: 1.00  Total pack years: 1.00    Types: Cigarettes   Smokeless tobacco: Never   Tobacco comments:    only as a teen   Vaping Use   Vaping Use: Never used  Substance and Sexual Activity   Alcohol use: Yes    Alcohol/week: 3.0 standard drinks of alcohol    Types: 3 Standard drinks or equivalent per week   Drug use: Never   Sexual activity: Not on file  Other Topics Concern   Not on file  Social History Narrative   Not on file   Social Determinants of Health   Financial Resource Strain: Not on file  Food Insecurity: Not on file  Transportation Needs: Not on file  Physical Activity: Not on file  Stress: Not on file  Social Connections: Not on file   Intimate Partner Violence: Not on file   Family History  Problem Relation Age of Onset   Heart disease Mother    Diabetes Mother    Stroke Mother    Stroke Father    Current Outpatient Medications on File Prior to Visit  Medication Sig   fluorometholone (FML) 0.1 % ophthalmic suspension SMARTSIG:In Eye(s)   Omega-3 Fatty Acids (FISH OIL) 1000 MG CAPS Take by mouth.   trimethoprim-polymyxin b (POLYTRIM) ophthalmic solution Place 1 drop into both eyes every 6 (six) hours. Up to 4 drops per day.   vitamin B-12 (CYANOCOBALAMIN) 100 MCG tablet Take 100 mcg by mouth daily.   meloxicam (MOBIC) 15 MG tablet Take 1 tablet (15 mg total) by mouth daily as needed for pain. (Patient not taking: Reported on 07/08/2022)   No current facility-administered medications on file prior to visit.    Review of Systems  Constitutional:  Negative for activity change, appetite change, chills, diaphoresis, fatigue and fever.  HENT:  Negative for congestion and hearing loss.   Eyes:  Negative for visual disturbance.  Respiratory:  Negative for cough, chest tightness, shortness of breath and wheezing.   Cardiovascular:  Negative for chest pain, palpitations and leg swelling.  Gastrointestinal:  Negative for abdominal pain, constipation, diarrhea, nausea and vomiting.  Genitourinary:  Negative for dysuria, frequency and hematuria.  Musculoskeletal:  Negative for arthralgias and neck pain.  Skin:  Negative for rash.  Neurological:  Negative for dizziness, weakness, light-headedness, numbness and headaches.  Hematological:  Negative for adenopathy.  Psychiatric/Behavioral:  Negative for behavioral problems, dysphoric mood and sleep disturbance.    Per HPI unless specifically indicated above      Objective:    BP 125/72 (BP Location: Right Arm, Patient Position: Sitting, Cuff Size: Large)   Pulse 86   Temp 98.2 F (36.8 C) (Oral)   Resp 18   Ht 5' 7.5" (1.715 m)   Wt 263 lb 9.6 oz (119.6 kg)   SpO2  98%   BMI 40.68 kg/m   Wt Readings from Last 3 Encounters:  07/08/22 263 lb 9.6 oz (119.6 kg)  09/18/21 253 lb (114.8 kg)  07/02/21 259 lb 3.2 oz (117.6 kg)    Physical Exam Vitals and nursing note reviewed.  Constitutional:      General: He is not in acute distress.    Appearance: He is well-developed. He is obese. He is not diaphoretic.     Comments: Well-appearing, comfortable, cooperative  HENT:     Head: Normocephalic and atraumatic.  Eyes:     General:        Right eye: No discharge.        Left eye: No  discharge.     Conjunctiva/sclera: Conjunctivae normal.     Pupils: Pupils are equal, round, and reactive to light.  Neck:     Thyroid: No thyromegaly.     Vascular: No carotid bruit.  Cardiovascular:     Rate and Rhythm: Normal rate and regular rhythm.     Pulses: Normal pulses.     Heart sounds: Normal heart sounds. No murmur heard. Pulmonary:     Effort: Pulmonary effort is normal. No respiratory distress.     Breath sounds: Normal breath sounds. No wheezing or rales.  Abdominal:     General: Bowel sounds are normal. There is no distension.     Palpations: Abdomen is soft. There is no mass.     Tenderness: There is no abdominal tenderness.  Musculoskeletal:        General: No tenderness. Normal range of motion.     Cervical back: Normal range of motion and neck supple.     Right lower leg: No edema.     Left lower leg: No edema.     Comments: Upper / Lower Extremities: - Normal muscle tone, strength bilateral upper extremities 5/5, lower extremities 5/5  Lymphadenopathy:     Cervical: No cervical adenopathy.  Skin:    General: Skin is warm and dry.     Findings: No erythema or rash.  Neurological:     Mental Status: He is alert and oriented to person, place, and time.     Comments: Distal sensation intact to light touch all extremities  Psychiatric:        Mood and Affect: Mood normal.        Behavior: Behavior normal.        Thought Content: Thought  content normal.     Comments: Well groomed, good eye contact, normal speech and thoughts      Results for orders placed or performed in visit on 06/30/22  PSA  Result Value Ref Range   PSA 0.86 < OR = 4.00 ng/mL  Lipid panel  Result Value Ref Range   Cholesterol 209 (H) <200 mg/dL   HDL 53 > OR = 40 mg/dL   Triglycerides 161 (H) <150 mg/dL   LDL Cholesterol (Calc) 118 (H) mg/dL (calc)   Total CHOL/HDL Ratio 3.9 <5.0 (calc)   Non-HDL Cholesterol (Calc) 156 (H) <130 mg/dL (calc)  Comprehensive Metabolic Panel (CMET)  Result Value Ref Range   Glucose, Bld 121 (H) 65 - 99 mg/dL   BUN 16 7 - 25 mg/dL   Creat 0.96 0.45 - 4.09 mg/dL   BUN/Creatinine Ratio SEE NOTE: 6 - 22 (calc)   Sodium 141 135 - 146 mmol/L   Potassium 4.5 3.5 - 5.3 mmol/L   Chloride 104 98 - 110 mmol/L   CO2 28 20 - 32 mmol/L   Calcium 9.7 8.6 - 10.3 mg/dL   Total Protein 7.1 6.1 - 8.1 g/dL   Albumin 4.9 3.6 - 5.1 g/dL   Globulin 2.2 1.9 - 3.7 g/dL (calc)   AG Ratio 2.2 1.0 - 2.5 (calc)   Total Bilirubin 0.6 0.2 - 1.2 mg/dL   Alkaline phosphatase (APISO) 56 35 - 144 U/L   AST 49 (H) 10 - 35 U/L   ALT 99 (H) 9 - 46 U/L  CBC with Differential  Result Value Ref Range   WBC 6.2 3.8 - 10.8 Thousand/uL   RBC 5.56 4.20 - 5.80 Million/uL   Hemoglobin 16.7 13.2 - 17.1 g/dL   HCT 81.1 91.4 -  50.0 %   MCV 86.7 80.0 - 100.0 fL   MCH 30.0 27.0 - 33.0 pg   MCHC 34.6 32.0 - 36.0 g/dL   RDW 40.0 86.7 - 61.9 %   Platelets 256 140 - 400 Thousand/uL   MPV 9.6 7.5 - 12.5 fL   Neutro Abs 3,615 1,500 - 7,800 cells/uL   Lymphs Abs 1,823 850 - 3,900 cells/uL   Absolute Monocytes 496 200 - 950 cells/uL   Eosinophils Absolute 217 15 - 500 cells/uL   Basophils Absolute 50 0 - 200 cells/uL   Neutrophils Relative % 58.3 %   Total Lymphocyte 29.4 %   Monocytes Relative 8.0 %   Eosinophils Relative 3.5 %   Basophils Relative 0.8 %  HgB A1c  Result Value Ref Range   Hgb A1c MFr Bld 6.0 (H) <5.7 % of total Hgb   Mean Plasma  Glucose 126 mg/dL   eAG (mmol/L) 7.0 mmol/L      Assessment & Plan:   Problem List Items Addressed This Visit     Elevated hemoglobin A1c    Mild elevated A1c to 6.0 Slight inc risk in PreDM Concern with obesity, HTN, HLD  Plan:  1. Not on any therapy currently  2. Encourage improved lifestyle - low carb, low sugar diet, reduce portion size, continue improving regular exercise - diet handout given Yearly check       Essential hypertension    Controlled HTN - Home BP readings none  No known complications     Plan:  1. Continue current BP regimen - Losartan 100mg  daily, Amlodipine 10mg  daily 2. Encourage improved lifestyle - low sodium diet, regular exercise 3. May monitor BP outside office      Relevant Medications   amLODipine (NORVASC) 10 MG tablet   losartan (COZAAR) 100 MG tablet   Mixed hyperlipidemia    Improved LDL still elevated TG Last lipid panel 06/2022 The 10-year ASCVD risk score (Arnett DK, et al., 2019) is: 21.4%  Plan: 1. Offered ASCVD risk reduction primary with Statin therapy - he declined, despite counseling benefit risk 2. Encourage improved lifestyle - low carb/cholesterol, reduce portion size, continue improving regular exercise Yearly lipid      Relevant Medications   amLODipine (NORVASC) 10 MG tablet   losartan (COZAAR) 100 MG tablet   Morbid obesity (HCC)    Morbid obesity BMI >40 with comorbid PreDiabetes, HTN, HLD Encourage wt loss lifestyle Discussion on weight management options lifestyle vs medication, consider GLP1 / Contrave, he declines at this time, notify me if change mind, seems limited coverage on GLP1      Other Visit Diagnoses     Annual physical exam    -  Primary       Updated Health Maintenance information Reviewed recent lab results with patient Encouraged improvement to lifestyle with diet and exercise Goal of weight loss  No orders of the defined types were placed in this encounter.    Meds ordered this  encounter  Medications   amLODipine (NORVASC) 10 MG tablet    Sig: Take 1 tablet (10 mg total) by mouth daily.    Dispense:  90 tablet    Refill:  3    Add future refills   losartan (COZAAR) 100 MG tablet    Sig: Take 1 tablet (100 mg total) by mouth daily.    Dispense:  90 tablet    Refill:  3    Add future refills     Follow up plan: Return  in about 1 year (around 07/09/2023) for 1 year fasting lab only then 1 week later Annual Physical.  Future labs ordered  Saralyn Pilar, DO [facility] 07/08/2022, 9:14 AM

## 2023-06-14 DIAGNOSIS — H02885 Meibomian gland dysfunction left lower eyelid: Secondary | ICD-10-CM | POA: Diagnosis not present

## 2023-06-14 DIAGNOSIS — H1045 Other chronic allergic conjunctivitis: Secondary | ICD-10-CM | POA: Diagnosis not present

## 2023-06-14 DIAGNOSIS — H02882 Meibomian gland dysfunction right lower eyelid: Secondary | ICD-10-CM | POA: Diagnosis not present

## 2023-06-14 DIAGNOSIS — H2513 Age-related nuclear cataract, bilateral: Secondary | ICD-10-CM | POA: Diagnosis not present

## 2023-06-21 DIAGNOSIS — M9905 Segmental and somatic dysfunction of pelvic region: Secondary | ICD-10-CM | POA: Diagnosis not present

## 2023-06-21 DIAGNOSIS — M9903 Segmental and somatic dysfunction of lumbar region: Secondary | ICD-10-CM | POA: Diagnosis not present

## 2023-06-21 DIAGNOSIS — M62830 Muscle spasm of back: Secondary | ICD-10-CM | POA: Diagnosis not present

## 2023-06-21 DIAGNOSIS — M545 Low back pain, unspecified: Secondary | ICD-10-CM | POA: Diagnosis not present

## 2023-06-24 DIAGNOSIS — M9903 Segmental and somatic dysfunction of lumbar region: Secondary | ICD-10-CM | POA: Diagnosis not present

## 2023-06-24 DIAGNOSIS — M545 Low back pain, unspecified: Secondary | ICD-10-CM | POA: Diagnosis not present

## 2023-06-24 DIAGNOSIS — M9905 Segmental and somatic dysfunction of pelvic region: Secondary | ICD-10-CM | POA: Diagnosis not present

## 2023-06-24 DIAGNOSIS — M62830 Muscle spasm of back: Secondary | ICD-10-CM | POA: Diagnosis not present

## 2023-06-27 DIAGNOSIS — M9905 Segmental and somatic dysfunction of pelvic region: Secondary | ICD-10-CM | POA: Diagnosis not present

## 2023-06-27 DIAGNOSIS — M9903 Segmental and somatic dysfunction of lumbar region: Secondary | ICD-10-CM | POA: Diagnosis not present

## 2023-06-27 DIAGNOSIS — M545 Low back pain, unspecified: Secondary | ICD-10-CM | POA: Diagnosis not present

## 2023-06-27 DIAGNOSIS — M62830 Muscle spasm of back: Secondary | ICD-10-CM | POA: Diagnosis not present

## 2023-07-01 DIAGNOSIS — M62830 Muscle spasm of back: Secondary | ICD-10-CM | POA: Diagnosis not present

## 2023-07-01 DIAGNOSIS — M9903 Segmental and somatic dysfunction of lumbar region: Secondary | ICD-10-CM | POA: Diagnosis not present

## 2023-07-01 DIAGNOSIS — M9905 Segmental and somatic dysfunction of pelvic region: Secondary | ICD-10-CM | POA: Diagnosis not present

## 2023-07-01 DIAGNOSIS — M545 Low back pain, unspecified: Secondary | ICD-10-CM | POA: Diagnosis not present

## 2023-07-04 DIAGNOSIS — M9905 Segmental and somatic dysfunction of pelvic region: Secondary | ICD-10-CM | POA: Diagnosis not present

## 2023-07-04 DIAGNOSIS — M62830 Muscle spasm of back: Secondary | ICD-10-CM | POA: Diagnosis not present

## 2023-07-04 DIAGNOSIS — M545 Low back pain, unspecified: Secondary | ICD-10-CM | POA: Diagnosis not present

## 2023-07-04 DIAGNOSIS — M9903 Segmental and somatic dysfunction of lumbar region: Secondary | ICD-10-CM | POA: Diagnosis not present

## 2023-07-08 ENCOUNTER — Other Ambulatory Visit: Payer: Medicare Other

## 2023-07-08 DIAGNOSIS — E782 Mixed hyperlipidemia: Secondary | ICD-10-CM

## 2023-07-08 DIAGNOSIS — M9903 Segmental and somatic dysfunction of lumbar region: Secondary | ICD-10-CM | POA: Diagnosis not present

## 2023-07-08 DIAGNOSIS — I10 Essential (primary) hypertension: Secondary | ICD-10-CM | POA: Diagnosis not present

## 2023-07-08 DIAGNOSIS — Z0000 Encounter for general adult medical examination without abnormal findings: Secondary | ICD-10-CM

## 2023-07-08 DIAGNOSIS — Z125 Encounter for screening for malignant neoplasm of prostate: Secondary | ICD-10-CM

## 2023-07-08 DIAGNOSIS — M9905 Segmental and somatic dysfunction of pelvic region: Secondary | ICD-10-CM | POA: Diagnosis not present

## 2023-07-08 DIAGNOSIS — M62830 Muscle spasm of back: Secondary | ICD-10-CM | POA: Diagnosis not present

## 2023-07-08 DIAGNOSIS — R7309 Other abnormal glucose: Secondary | ICD-10-CM

## 2023-07-08 DIAGNOSIS — M545 Low back pain, unspecified: Secondary | ICD-10-CM | POA: Diagnosis not present

## 2023-07-09 LAB — CBC WITH DIFFERENTIAL/PLATELET
Absolute Lymphocytes: 2165 {cells}/uL (ref 850–3900)
Absolute Monocytes: 482 {cells}/uL (ref 200–950)
Basophils Absolute: 53 {cells}/uL (ref 0–200)
Basophils Relative: 0.8 %
Eosinophils Absolute: 271 {cells}/uL (ref 15–500)
Eosinophils Relative: 4.1 %
HCT: 50.4 % — ABNORMAL HIGH (ref 38.5–50.0)
Hemoglobin: 16.7 g/dL (ref 13.2–17.1)
MCH: 29.4 pg (ref 27.0–33.0)
MCHC: 33.1 g/dL (ref 32.0–36.0)
MCV: 88.7 fL (ref 80.0–100.0)
MPV: 10.2 fL (ref 7.5–12.5)
Monocytes Relative: 7.3 %
Neutro Abs: 3630 {cells}/uL (ref 1500–7800)
Neutrophils Relative %: 55 %
Platelets: 258 10*3/uL (ref 140–400)
RBC: 5.68 10*6/uL (ref 4.20–5.80)
RDW: 13.1 % (ref 11.0–15.0)
Total Lymphocyte: 32.8 %
WBC: 6.6 10*3/uL (ref 3.8–10.8)

## 2023-07-09 LAB — COMPLETE METABOLIC PANEL WITH GFR
AG Ratio: 2.2 (calc) (ref 1.0–2.5)
ALT: 32 U/L (ref 9–46)
AST: 22 U/L (ref 10–35)
Albumin: 5.1 g/dL (ref 3.6–5.1)
Alkaline phosphatase (APISO): 70 U/L (ref 35–144)
BUN: 20 mg/dL (ref 7–25)
CO2: 26 mmol/L (ref 20–32)
Calcium: 9.9 mg/dL (ref 8.6–10.3)
Chloride: 105 mmol/L (ref 98–110)
Creat: 0.81 mg/dL (ref 0.70–1.28)
Globulin: 2.3 g/dL (ref 1.9–3.7)
Glucose, Bld: 106 mg/dL — ABNORMAL HIGH (ref 65–99)
Potassium: 4.7 mmol/L (ref 3.5–5.3)
Sodium: 140 mmol/L (ref 135–146)
Total Bilirubin: 0.5 mg/dL (ref 0.2–1.2)
Total Protein: 7.4 g/dL (ref 6.1–8.1)
eGFR: 94 mL/min/{1.73_m2} (ref >=60)

## 2023-07-09 LAB — LIPID PANEL
Cholesterol: 212 mg/dL — ABNORMAL HIGH (ref <200)
HDL: 54 mg/dL (ref >=40)
LDL Cholesterol (Calc): 131 mg/dL — ABNORMAL HIGH
Non-HDL Cholesterol (Calc): 158 mg/dL — ABNORMAL HIGH (ref <130)
Total CHOL/HDL Ratio: 3.9 (calc) (ref <5.0)
Triglycerides: 155 mg/dL — ABNORMAL HIGH (ref <150)

## 2023-07-09 LAB — TSH: TSH: 1.33 m[IU]/L (ref 0.40–4.50)

## 2023-07-09 LAB — PSA: PSA: 1.12 ng/mL (ref <=4.00)

## 2023-07-09 LAB — HEMOGLOBIN A1C
Hgb A1c MFr Bld: 5.5 %{Hb} (ref <5.7)
Mean Plasma Glucose: 111 mg/dL
eAG (mmol/L): 6.2 mmol/L

## 2023-07-15 ENCOUNTER — Ambulatory Visit (INDEPENDENT_AMBULATORY_CARE_PROVIDER_SITE_OTHER): Payer: Medicare Other | Admitting: Family Medicine

## 2023-07-15 VITALS — BP 130/70 | HR 68 | Ht 67.5 in | Wt 236.0 lb

## 2023-07-15 DIAGNOSIS — E782 Mixed hyperlipidemia: Secondary | ICD-10-CM

## 2023-07-15 DIAGNOSIS — Z0000 Encounter for general adult medical examination without abnormal findings: Secondary | ICD-10-CM

## 2023-07-15 DIAGNOSIS — M62830 Muscle spasm of back: Secondary | ICD-10-CM | POA: Diagnosis not present

## 2023-07-15 DIAGNOSIS — R7309 Other abnormal glucose: Secondary | ICD-10-CM | POA: Diagnosis not present

## 2023-07-15 DIAGNOSIS — Z1283 Encounter for screening for malignant neoplasm of skin: Secondary | ICD-10-CM

## 2023-07-15 DIAGNOSIS — M9905 Segmental and somatic dysfunction of pelvic region: Secondary | ICD-10-CM | POA: Diagnosis not present

## 2023-07-15 DIAGNOSIS — M9903 Segmental and somatic dysfunction of lumbar region: Secondary | ICD-10-CM | POA: Diagnosis not present

## 2023-07-15 DIAGNOSIS — M170 Bilateral primary osteoarthritis of knee: Secondary | ICD-10-CM

## 2023-07-15 DIAGNOSIS — I10 Essential (primary) hypertension: Secondary | ICD-10-CM | POA: Diagnosis not present

## 2023-07-15 DIAGNOSIS — M545 Low back pain, unspecified: Secondary | ICD-10-CM | POA: Diagnosis not present

## 2023-07-15 MED ORDER — AMLODIPINE BESYLATE-VALSARTAN 10-160 MG PO TABS: 1.0000 | ORAL_TABLET | Freq: Every day | ORAL | 3 refills | Status: DC

## 2023-07-15 MED ORDER — LOSARTAN POTASSIUM 100 MG PO TABS: 100.0000 mg | ORAL_TABLET | Freq: Every day | ORAL | 3 refills | Status: DC

## 2023-07-15 NOTE — Patient Instructions (Addendum)
 Thank you for coming to the office today.  Stop Amlodipine  Switch to Losartan  100mg  daily ONLY Let me know if the BP readings are still in good range, we can consider switching / increasing the Valsartan  dose.  Coronary Calcium Score Cardiac CT Scan. This is a screening test for patients aged 73-50+ with cardiovascular risk factors or who are healthy but would be interested in Cardiovascular Screening for heart disease. Even if there is a family history of heart disease, this imaging can be useful. Typically it can be done every 5+ years or at a different timeline we agree on  The scan will look at the chest and mainly focus on the heart and identify early signs of calcium build up or blockages within the heart arteries. It is not 100% accurate for identifying blockages or heart disease, but it is useful to help us  predict who may have some early changes or be at risk in the future for a heart attack or cardiovascular problem.  The results are reviewed by a Cardiologist and they will document the results. It should become available on MyChart. Typically the results are divided into percentiles based on other patients of the same demographic and age. So it will compare your risk to others similar to you. If you have a higher score >99 or higher percentile >75%tile, it is recommended to consider Statin cholesterol therapy and or referral to Cardiologist. I will try to help explain your results and if we have questions we can contact the Cardiologist.  You will be contacted for scheduling. Usually it is done at any imaging facility through [facility], [facility] or [facility].  The cost is $99 flat fee total and it does not go through insurance, so no authorization is required.    DUE for FASTING BLOOD WORK (no food or drink after midnight before the lab appointment, only water or coffee without cream/sugar on the morning of)  SCHEDULE Lab Only visit in the  morning at the clinic for lab draw in 1 YEAR  - Make sure Lab Only appointment is at about 1 week before your next appointment, so that results will be available  For Lab Results, once available within 2-3 days of blood draw, you can can log in to MyChart online to view your results and a brief explanation. Also, we can discuss results at next follow-up visit.   Please schedule a Follow-up Appointment to: Return in about 1 year (around 07/14/2024) for 1 year fasting lab > 1 week later Annual Physical.  If you have any other questions or concerns, please feel free to call the office or send a message through MyChart. You may also schedule an earlier appointment if necessary.  Additionally, you may be receiving a survey about your experience at our office within a few days to 1 week by e-mail or mail. We value your feedback.  Marsa Officer, DO [facility], [place]

## 2023-07-15 NOTE — Progress Notes (Signed)
 Subjective:    Patient ID: Elna Paras, male    DOB: 1951-05-14, 73 y.o.   MRN: [id number]  Humberto Addo is a 73 y.o. male presenting on 07/15/2023 for Annual Exam   HPI  Discussed the use of AI scribe software for clinical note transcription with the patient, who gave verbal consent to proceed.  History of Present Illness    The patient, a 73 year old with a history of hypertension and hyperlipidemia, presented for his annual preventative visit. Over the past four months, the patient has made significant lifestyle changes, resulting in a weight loss of approximately 30 pounds. This was achieved by eliminating bread, potatoes, pasta, sweets, and chips from his diet, and increasing intake of vegetables, chicken, and fish. The patient also reported consuming low-carb Atkins bars for sweets and maintaining a diet rich in cottage cheese and other low-fat dairy products.  Despite the weight loss, the patient reported persistent bilateral shoulder pain, which he attributes to his sleeping position. He also mentioned an ingrown toenail that has been causing discomfort. The patient has been managing this at home by lifting the edge of the nail and inserting a piece of cotton to encourage the nail to grow outwards.   CHRONIC HTN: Reports no new concerns, continues on meds does well Current Meds - Losartan  100mg  daily, Amlodipine  10mg  daily  - requesting to combine both meds into one pill Reports good compliance, took meds today. Tolerating well, w/o complaints. Lifestyle: - Diet: balanced, heavy on vegetables onions Denies CP, dyspnea, HA, edema, dizziness / lightheadedness   Osteoarthritis Multiple joints, hand/wrist/fingers Reports chronic history of repetitive overuse activity with hands. He has prior long history of working with hands and often tight grip, and says he has had wear and tear, previous dx arthritis, has used OTC treatments occasionally. Wearing gloves at night to help warm  hands and improve mobility and function  He takes Tylenol / Ibuprofen PRN with some good results Admits stiffness and sore worse in AM Worse with winter weather. Plays golf multiple times per week Also going to chiropractor for back and it has improved   HYPERLIPIDEMIA: Lipid panel elevated up to 131 Not on statin therapy. He declines Has increased meat intake but overall improved diet exercise wt loss   Elevated LFTs - resolved with weight loss   GERD Controlled on OTC Omeprazole 20mg  daily.   Pre Diabetes Last lab A1c 5.5, improved with weight loss and diet lifestyle improvements   He also expressed interest in a dermatology referral for a skin cancer screening and evaluation of multiple skin lesions.     Health Maintenance:   Cardiovascular screening - reports 5-7 years ago had slightly abnormal EKG, and sent to Cardiology, had ECHO that was entirely normal Not interested in Coronary Calcium score CT at this time.  Shingrix , Prevnar-20 updated   Last colonoscopy, around 2014. Request record. Again - offered cologuard. He declines today.   Prostate CA Screening: Last PSA 1.12, negative. Currently asymptomatic. No known family history of prostate CA.      07/15/2023    8:57 AM 07/08/2022    9:00 AM 07/08/2022    8:59 AM  Depression screen PHQ 2/9  Decreased Interest 0 0 0  Down, Depressed, Hopeless 0 0 0  PHQ - 2 Score 0 0 0  Altered sleeping  0   Tired, decreased energy  0   Change in appetite  0   Feeling bad or failure about yourself   0  Trouble concentrating  0   Moving slowly or fidgety/restless  0   Suicidal thoughts  0   PHQ-9 Score  0   Difficult doing work/chores  Not difficult at all        07/15/2023    8:57 AM 07/08/2022    9:00 AM 07/02/2021    9:06 AM 05/17/2019   10:29 AM  GAD 7 : Generalized Anxiety Score  Nervous, Anxious, on Edge 0 0 0 0  Control/stop worrying 0 0 0 0  Worry too much - different things 0 0 0 0  Trouble relaxing 0 0 0 0   Restless 0 0 0 0  Easily annoyed or irritable 0 0 0 0  Afraid - awful might happen 0 0 0 0  Total GAD 7 Score 0 0 0 0  Anxiety Difficulty  Not difficult at all Not difficult at all      Past Medical History:  Diagnosis Date   Allergy    Hypertension    Past Surgical History:  Procedure Laterality Date   CHOLECYSTECTOMY  05/1987   Social History   Socioeconomic History   Marital status: Married    Spouse name: Not on file   Number of children: Not on file   Years of education: High School   Highest education level: High school graduate  Occupational History   Not on file  Tobacco Use   Smoking status: Former    Current packs/day: 1.00    Average packs/day: 1 pack/day for 1 year (1.0 ttl pk-yrs)    Types: Cigarettes   Smokeless tobacco: Never   Tobacco comments:    only as a teen   Vaping Use   Vaping status: Never Used  Substance and Sexual Activity   Alcohol use: Yes    Alcohol/week: 3.0 standard drinks of alcohol    Types: 3 Standard drinks or equivalent per week   Drug use: Never   Sexual activity: Not on file  Other Topics Concern   Not on file  Social History Narrative   Not on file   Social Drivers of Health   Financial Resource Strain: Not on file  Food Insecurity: Not on file  Transportation Needs: Not on file  Physical Activity: Not on file  Stress: Not on file  Social Connections: Not on file  Intimate Partner Violence: Not on file   Family History  Problem Relation Age of Onset   Heart disease Mother    Diabetes Mother    Stroke Mother    Stroke Father    Current Outpatient Medications on File Prior to Visit  Medication Sig   fluorometholone (FML) 0.1 % ophthalmic suspension SMARTSIG:In Eye(s)   Omega-3 Fatty Acids (FISH OIL) 1000 MG CAPS Take by mouth.   trimethoprim -polymyxin b  (POLYTRIM ) ophthalmic solution Place 1 drop into both eyes every 6 (six) hours. Up to 4 drops per day.   vitamin B-12 (CYANOCOBALAMIN) 100 MCG tablet Take 100  mcg by mouth daily.   No current facility-administered medications on file prior to visit.    Review of Systems  Constitutional:  Negative for activity change, appetite change, chills, diaphoresis, fatigue and fever.  HENT:  Negative for congestion and hearing loss.   Eyes:  Negative for visual disturbance.  Respiratory:  Negative for cough, chest tightness, shortness of breath and wheezing.   Cardiovascular:  Negative for chest pain, palpitations and leg swelling.  Gastrointestinal:  Negative for abdominal pain, constipation, diarrhea, nausea and vomiting.  Genitourinary:  Negative for  dysuria, frequency and hematuria.  Musculoskeletal:  Positive for arthralgias. Negative for neck pain.  Skin:  Negative for rash.  Neurological:  Negative for dizziness, weakness, light-headedness, numbness and headaches.  Hematological:  Negative for adenopathy.  Psychiatric/Behavioral:  Negative for behavioral problems, dysphoric mood and sleep disturbance.    Per HPI unless specifically indicated above     Objective:    BP 130/70   Pulse 68   Ht 5' 7.5 (1.715 m)   Wt 236 lb (107 kg)   SpO2 97%   BMI 36.42 kg/m   Wt Readings from Last 3 Encounters:  07/15/23 236 lb (107 kg)  07/08/22 263 lb 9.6 oz (119.6 kg)  09/18/21 253 lb (114.8 kg)    Physical Exam Vitals and nursing note reviewed.  Constitutional:      General: He is not in acute distress.    Appearance: He is well-developed. He is not diaphoretic.     Comments: Well-appearing, comfortable, cooperative  HENT:     Head: Normocephalic and atraumatic.  Eyes:     General:        Right eye: No discharge.        Left eye: No discharge.     Conjunctiva/sclera: Conjunctivae normal.     Pupils: Pupils are equal, round, and reactive to light.  Neck:     Thyroid : No thyromegaly.     Vascular: No carotid bruit.  Cardiovascular:     Rate and Rhythm: Normal rate and regular rhythm.     Pulses: Normal pulses.     Heart sounds: Normal  heart sounds. No murmur heard. Pulmonary:     Effort: Pulmonary effort is normal. No respiratory distress.     Breath sounds: Normal breath sounds. No wheezing or rales.  Abdominal:     General: Bowel sounds are normal. There is no distension.     Palpations: Abdomen is soft. There is no mass.     Tenderness: There is no abdominal tenderness.  Musculoskeletal:        General: No tenderness. Normal range of motion.     Cervical back: Normal range of motion and neck supple.     Right lower leg: No edema.     Left lower leg: No edema.     Comments: Upper / Lower Extremities: - Normal muscle tone, strength bilateral upper extremities 5/5, lower extremities 5/5  Lymphadenopathy:     Cervical: No cervical adenopathy.  Skin:    General: Skin is warm and dry.     Findings: No erythema or rash.  Neurological:     Mental Status: He is alert and oriented to person, place, and time.     Comments: Distal sensation intact to light touch all extremities  Psychiatric:        Mood and Affect: Mood normal.        Behavior: Behavior normal.        Thought Content: Thought content normal.     Comments: Well groomed, good eye contact, normal speech and thoughts     Results for orders placed or performed in visit on 07/08/23  TSH   Collection Time: 07/08/23  7:56 AM  Result Value Ref Range   TSH 1.33 0.40 - 4.50 mIU/L  PSA   Collection Time: 07/08/23  7:56 AM  Result Value Ref Range   PSA 1.12 < OR = 4.00 ng/mL  Hemoglobin A1c   Collection Time: 07/08/23  7:56 AM  Result Value Ref Range   Hgb A1c MFr  Bld 5.5 <5.7 % of total Hgb   Mean Plasma Glucose 111 mg/dL   eAG (mmol/L) 6.2 mmol/L  Lipid panel   Collection Time: 07/08/23  7:56 AM  Result Value Ref Range   Cholesterol 212 (H) <200 mg/dL   HDL 54 > OR = 40 mg/dL   Triglycerides 844 (H) <150 mg/dL   LDL Cholesterol (Calc) 131 (H) mg/dL (calc)   Total CHOL/HDL Ratio 3.9 <5.0 (calc)   Non-HDL Cholesterol (Calc) 158 (H) <130 mg/dL  (calc)  CBC with Differential/Platelet   Collection Time: 07/08/23  7:56 AM  Result Value Ref Range   WBC 6.6 3.8 - 10.8 Thousand/uL   RBC 5.68 4.20 - 5.80 Million/uL   Hemoglobin 16.7 13.2 - 17.1 g/dL   HCT 49.5 (H) 61.4 - 49.9 %   MCV 88.7 80.0 - 100.0 fL   MCH 29.4 27.0 - 33.0 pg   MCHC 33.1 32.0 - 36.0 g/dL   RDW 86.8 88.9 - 84.9 %   Platelets 258 140 - 400 Thousand/uL   MPV 10.2 7.5 - 12.5 fL   Neutro Abs 3,630 1,500 - 7,800 cells/uL   Absolute Lymphocytes 2,165 850 - 3,900 cells/uL   Absolute Monocytes 482 200 - 950 cells/uL   Eosinophils Absolute 271 15 - 500 cells/uL   Basophils Absolute 53 0 - 200 cells/uL   Neutrophils Relative % 55 %   Total Lymphocyte 32.8 %   Monocytes Relative 7.3 %   Eosinophils Relative 4.1 %   Basophils Relative 0.8 %  COMPLETE METABOLIC PANEL WITH GFR   Collection Time: 07/08/23  7:56 AM  Result Value Ref Range   Glucose, Bld 106 (H) 65 - 99 mg/dL   BUN 20 7 - 25 mg/dL   Creat 9.18 9.29 - 8.71 mg/dL   eGFR 94 > OR = 60 fO/fpw/8.26f7   BUN/Creatinine Ratio SEE NOTE: 6 - 22 (calc)   Sodium 140 135 - 146 mmol/L   Potassium 4.7 3.5 - 5.3 mmol/L   Chloride 105 98 - 110 mmol/L   CO2 26 20 - 32 mmol/L   Calcium 9.9 8.6 - 10.3 mg/dL   Total Protein 7.4 6.1 - 8.1 g/dL   Albumin 5.1 3.6 - 5.1 g/dL   Globulin 2.3 1.9 - 3.7 g/dL (calc)   AG Ratio 2.2 1.0 - 2.5 (calc)   Total Bilirubin 0.5 0.2 - 1.2 mg/dL   Alkaline phosphatase (APISO) 70 35 - 144 U/L   AST 22 10 - 35 U/L   ALT 32 9 - 46 U/L      Assessment & Plan:   Problem List Items Addressed This Visit     Elevated hemoglobin A1c   Essential hypertension   Relevant Medications   losartan  (COZAAR ) 100 MG tablet   Mixed hyperlipidemia   Relevant Medications   losartan  (COZAAR ) 100 MG tablet   Morbid obesity (HCC)   Primary osteoarthritis of both knees   Other Visit Diagnoses       Annual physical exam    -  Primary     Screening for skin cancer       Relevant Orders    Ambulatory referral to Dermatology        Updated Health Maintenance information Reviewed recent lab results with patient Encouraged improvement to lifestyle with diet and exercise Goal of weight loss   Weight Management Significant weight loss achieved through dietary modifications, including low-carb diet. -Continue current dietary regimen and aim for additional weight loss.  Hypertension Blood pressure  well-controlled at 130/70. Attempted to combine medications into combo pill today, Losartan  100mg  + Amlodipine , switched to Amlodipine -Valsartan  10-160mg , but patient returned to office to request change back to Losartan  100mg  and stop taking Amlodipine . Due to weight loss prefers to try reduced pills   Hyperlipidemia The 10-year ASCVD risk score (Arnett DK, et al., 2019) is: 24.2%   Values used to calculate the score:     Age: 6 years     Sex: Male     Is Non-Hispanic African American: No     Diabetic: No     Tobacco smoker: No     Systolic Blood Pressure: 130 mmHg     Is BP treated: Yes     HDL Cholesterol: 54 mg/dL     Total Cholesterol: 212 mg/dL  LDL slightly elevated, but overall cholesterol levels have improved. -Continue current dietary regimen and monitor cholesterol levels. Offer Statin therapy and Coronary Artery CT Scan, he declines today  Pre-Diabetes A1c improved to 5.5, indicating good glycemic control. -Continue current dietary regimen and monitor A1c levels.  LFTs - resolved w/ wt loss  Skin Surveillance  Expressed interest in dermatology referral for skin cancer screening and multiple skin lesions. -Refer to [facility] for routine dermatology consultation.     Orders Placed This Encounter  Procedures   Ambulatory referral to Dermatology    Referral Priority:   Routine    Referral Type:   Consultation    Referral Reason:   Specialty Services Required    Requested Specialty:   Dermatology    Number of Visits Requested:   1    Meds  ordered this encounter  Medications   DISCONTD: amLODipine -valsartan  (EXFORGE ) 10-160 MG tablet    Sig: Take 1 tablet by mouth daily.    Dispense:  90 tablet    Refill:  3    Switch from Amlodipine  + Losartan  to combined pill.   losartan  (COZAAR ) 100 MG tablet    Sig: Take 1 tablet (100 mg total) by mouth daily.    Dispense:  90 tablet    Refill:  3    Patient requested to return to Losartan  100mg  instead of the new combination pill Amlodipine -Valsartan . Please discontinue combo pill and fill Losartan  100mg  daily     Follow up plan: Return in about 1 year (around 07/14/2024) for 1 year fasting lab > 1 week later Annual Physical.  Future labs 07/20/24  Marsa Officer, DO [facility] 07/15/2023, 9:18 AM

## 2023-07-16 ENCOUNTER — Encounter: Payer: Self-pay | Admitting: Family Medicine

## 2023-07-18 ENCOUNTER — Other Ambulatory Visit: Payer: Self-pay | Admitting: Family Medicine

## 2023-07-18 DIAGNOSIS — I10 Essential (primary) hypertension: Secondary | ICD-10-CM

## 2023-07-19 NOTE — Telephone Encounter (Signed)
 Discontinued on 07/15/23 by provider, will refuse this request.  Requested Prescriptions  Pending Prescriptions Disp Refills   amLODipine  (NORVASC ) 10 MG tablet [Pharmacy Med Name: AMLODIPINE  BESYLATE 10MG  TABLETS] 90 tablet 3    Sig: TAKE 1 TABLET(10 MG) BY MOUTH DAILY     Cardiovascular: Calcium Channel Blockers 2 Passed - 07/19/2023  8:24 PM      Passed - Last BP in normal range    BP Readings from Last 1 Encounters:  07/15/23 130/70         Passed - Last Heart Rate in normal range    Pulse Readings from Last 1 Encounters:  07/15/23 68         Passed - Valid encounter within last 6 months    Recent Outpatient Visits           4 days ago Annual physical exam   [place] [facility] [place], Marsa PARAS, DO   1 year ago Annual physical exam   [place] [facility] [place] Marsa PARAS, DO   1 year ago Norovirus   [facility] [place], Marsa PARAS, DO   2 years ago Annual physical exam   [place] [facility] Marsa PARAS, DO   3 years ago Annual physical exam   [place] [facility] Marsa PARAS, DO       Future Appointments             In 1 year [place], Marsa PARAS, DO [facility]

## 2023-07-25 ENCOUNTER — Ambulatory Visit: Payer: Self-pay

## 2023-07-25 DIAGNOSIS — I10 Essential (primary) hypertension: Secondary | ICD-10-CM

## 2023-07-25 MED ORDER — LOSARTAN POTASSIUM 100 MG PO TABS: 100.0000 mg | ORAL_TABLET | Freq: Every day | ORAL | 3 refills | Status: DC

## 2023-07-25 MED ORDER — AMLODIPINE BESYLATE 10 MG PO TABS: 10.0000 mg | ORAL_TABLET | Freq: Every day | ORAL | 3 refills | Status: DC

## 2023-07-25 NOTE — Telephone Encounter (Signed)
 Understood. I will send both new orders separate pills to pharmacy  Saralyn Pilar, DO [facility] 07/25/2023, 11:48 AM

## 2023-07-25 NOTE — Telephone Encounter (Signed)
  Chief Complaint: Medication question Symptoms:  Frequency: now Pertinent Negatives: Patient denies  Disposition: [] ED /[] Urgent Care (no appt availability in office) / [] Appointment(In office/virtual)/ []  [facility] [facility]/ [] Home Care/ [] Refused Recommended Disposition /[] [facility]/ [x]  Follow-up with PCP Additional Notes: Call from Kaitlyn at [facility] regarding HTN medication. Pt is there to pick up medication. She has Losartan /amlodipine  combination prepared. She notes that pt also has Amlodipine /losartan  prescribed. Pharmacy will hold medication for now. Please review and advise pharmacy what medication pt should be taking.   Reason for Disposition  [1] Caller has URGENT medicine question about med that PCP or specialist prescribed AND [2] triager unable to answer question  Answer Assessment - Initial Assessment Questions 1. NAME of MEDICINE: What medicine(s) are you calling about?     HTN medication 2. QUESTION: What is your question? (e.g., double dose of medicine, side effect)     Which medication soul pt be taking?  Protocols used: Medication Question Call-A-AH

## 2023-07-25 NOTE — Addendum Note (Signed)
 Addended by: Smitty Cords on: 07/25/2023 11:48 AM   Modules accepted: Orders

## 2023-07-25 NOTE — Telephone Encounter (Signed)
 My last note has the information for this one. 07/15/23 I saw him.  Under plan for HYPERTENSION.  I attempted to combine the BP medications into one combo pill Amlodipine -Valsartan  10-160mg .  However, patient walked back into office later that day and requested to switch it back to single pills.  Amlodipine  10mg  + Losartan  100mg .  He then asked as well to pause or stop taking Amlodipine  and just take Losartan  since his BP has improved.  So, the answer is single pills, and he may only be on Losartan  100mg  daily now.  To avoid confusion I would suggest contacting patient first to confirm with him and then contact pharmacy after.  Marsa Officer, DO [facility] 07/25/2023, 11:32 AM

## 2023-08-22 DIAGNOSIS — M62830 Muscle spasm of back: Secondary | ICD-10-CM | POA: Diagnosis not present

## 2023-08-22 DIAGNOSIS — M9903 Segmental and somatic dysfunction of lumbar region: Secondary | ICD-10-CM | POA: Diagnosis not present

## 2023-08-22 DIAGNOSIS — M9905 Segmental and somatic dysfunction of pelvic region: Secondary | ICD-10-CM | POA: Diagnosis not present

## 2023-08-22 DIAGNOSIS — M545 Low back pain, unspecified: Secondary | ICD-10-CM | POA: Diagnosis not present

## 2023-09-19 DIAGNOSIS — M62830 Muscle spasm of back: Secondary | ICD-10-CM | POA: Diagnosis not present

## 2023-09-19 DIAGNOSIS — M9905 Segmental and somatic dysfunction of pelvic region: Secondary | ICD-10-CM | POA: Diagnosis not present

## 2023-09-19 DIAGNOSIS — M9903 Segmental and somatic dysfunction of lumbar region: Secondary | ICD-10-CM | POA: Diagnosis not present

## 2023-09-19 DIAGNOSIS — M545 Low back pain, unspecified: Secondary | ICD-10-CM | POA: Diagnosis not present

## 2023-10-24 ENCOUNTER — Encounter: Payer: Self-pay | Admitting: Family Medicine

## 2023-10-24 ENCOUNTER — Ambulatory Visit (INDEPENDENT_AMBULATORY_CARE_PROVIDER_SITE_OTHER): Admitting: Family Medicine

## 2023-10-24 VITALS — BP 130/76 | HR 70 | Ht 67.5 in | Wt 231.0 lb

## 2023-10-24 DIAGNOSIS — G5793 Unspecified mononeuropathy of bilateral lower limbs: Secondary | ICD-10-CM | POA: Diagnosis not present

## 2023-10-24 NOTE — Progress Notes (Unsigned)
 Subjective:    Patient ID: Cody Armstrong, male    DOB: 1951/04/21, 73 y.o.   MRN: [id number]  Cody Armstrong is a 73 y.o. male presenting on 10/24/2023 for No chief complaint on file.   HPI  ***  ***Tried Vaseline and Voltaren with success   Health Maintenance: ***     10/24/2023    9:38 AM 07/15/2023    8:57 AM 07/08/2022    9:00 AM  Depression screen PHQ 2/9  Decreased Interest 0 0 0  Down, Depressed, Hopeless 0 0 0  PHQ - 2 Score 0 0 0  Altered sleeping 0  0  Tired, decreased energy 0  0  Change in appetite 0  0  Feeling bad or failure about yourself  0  0  Trouble concentrating 0  0  Moving slowly or fidgety/restless 0  0  Suicidal thoughts 0  0  PHQ-9 Score 0  0  Difficult doing work/chores   Not difficult at all       10/24/2023    9:38 AM 07/15/2023    8:57 AM 07/08/2022    9:00 AM 07/02/2021    9:06 AM  GAD 7 : Generalized Anxiety Score  Nervous, Anxious, on Edge 0 0 0 0  Control/stop worrying 0 0 0 0  Worry too much - different things 0 0 0 0  Trouble relaxing 0 0 0 0  Restless 0 0 0 0  Easily annoyed or irritable 0 0 0 0  Afraid - awful might happen 0 0 0 0  Total GAD 7 Score 0 0 0 0  Anxiety Difficulty   Not difficult at all Not difficult at all    Social History   Tobacco Use   Smoking status: Former    Current packs/day: 1.00    Average packs/day: 1 pack/day for 1 year (1.0 ttl pk-yrs)    Types: Cigarettes   Smokeless tobacco: Never   Tobacco comments:    only as a teen   Vaping Use   Vaping status: Never Used  Substance Use Topics   Alcohol use: Yes    Alcohol/week: 3.0 standard drinks of alcohol    Types: 3 Standard drinks or equivalent per week   Drug use: Never    Review of Systems Per HPI unless specifically indicated above     Objective:    BP 130/76 (BP Location: Right Arm, Patient Position: Sitting, Cuff Size: Normal)   Pulse 70   Ht 5' 7.5" (1.715 m)   Wt 231 lb (104.8 kg)   SpO2 99%   BMI 35.65 kg/m   Wt  Readings from Last 3 Encounters:  10/24/23 231 lb (104.8 kg)  07/15/23 236 lb (107 kg)  07/08/22 263 lb 9.6 oz (119.6 kg)    Physical Exam  ***Monofilament  Results for orders placed or performed in visit on 07/08/23  TSH   Collection Time: 07/08/23  7:56 AM  Result Value Ref Range   TSH 1.33 0.40 - 4.50 mIU/L  PSA   Collection Time: 07/08/23  7:56 AM  Result Value Ref Range   PSA 1.12 < OR = 4.00 ng/mL  Hemoglobin A1c   Collection Time: 07/08/23  7:56 AM  Result Value Ref Range   Hgb A1c MFr Bld 5.5 <5.7 % of total Hgb   Mean Plasma Glucose 111 mg/dL   eAG (mmol/L) 6.2 mmol/L  Lipid panel   Collection Time: 07/08/23  7:56 AM  Result Value Ref Range   Cholesterol 212 (  H) <200 mg/dL   HDL 54 > OR = 40 mg/dL   Triglycerides 409 (H) <150 mg/dL   LDL Cholesterol (Calc) 131 (H) mg/dL (calc)   Total CHOL/HDL Ratio 3.9 <5.0 (calc)   Non-HDL Cholesterol (Calc) 158 (H) <130 mg/dL (calc)  CBC with Differential/Platelet   Collection Time: 07/08/23  7:56 AM  Result Value Ref Range   WBC 6.6 3.8 - 10.8 Thousand/uL   RBC 5.68 4.20 - 5.80 Million/uL   Hemoglobin 16.7 13.2 - 17.1 g/dL   HCT 81.1 (H) 91.4 - 78.2 %   MCV 88.7 80.0 - 100.0 fL   MCH 29.4 27.0 - 33.0 pg   MCHC 33.1 32.0 - 36.0 g/dL   RDW 95.6 21.3 - 08.6 %   Platelets 258 140 - 400 Thousand/uL   MPV 10.2 7.5 - 12.5 fL   Neutro Abs 3,630 1,500 - 7,800 cells/uL   Absolute Lymphocytes 2,165 850 - 3,900 cells/uL   Absolute Monocytes 482 200 - 950 cells/uL   Eosinophils Absolute 271 15 - 500 cells/uL   Basophils Absolute 53 0 - 200 cells/uL   Neutrophils Relative % 55 %   Total Lymphocyte 32.8 %   Monocytes Relative 7.3 %   Eosinophils Relative 4.1 %   Basophils Relative 0.8 %  COMPLETE METABOLIC PANEL WITH GFR   Collection Time: 07/08/23  7:56 AM  Result Value Ref Range   Glucose, Bld 106 (H) 65 - 99 mg/dL   BUN 20 7 - 25 mg/dL   Creat 5.78 4.69 - 6.29 mg/dL   eGFR 94 > OR = 60 BM/WUX/3.24M0   BUN/Creatinine  Ratio SEE NOTE: 6 - 22 (calc)   Sodium 140 135 - 146 mmol/L   Potassium 4.7 3.5 - 5.3 mmol/L   Chloride 105 98 - 110 mmol/L   CO2 26 20 - 32 mmol/L   Calcium 9.9 8.6 - 10.3 mg/dL   Total Protein 7.4 6.1 - 8.1 g/dL   Albumin 5.1 3.6 - 5.1 g/dL   Globulin 2.3 1.9 - 3.7 g/dL (calc)   AG Ratio 2.2 1.0 - 2.5 (calc)   Total Bilirubin 0.5 0.2 - 1.2 mg/dL   Alkaline phosphatase (APISO) 70 35 - 144 U/L   AST 22 10 - 35 U/L   ALT 32 9 - 46 U/L      Assessment & Plan:   Problem List Items Addressed This Visit   None    No orders of the defined types were placed in this encounter.   No orders of the defined types were placed in this encounter.   Follow up plan: No follow-ups on file.  Future labs ordered for ***  Domingo Friend, DO [facility] 10/24/2023, 9:57 AM

## 2023-10-24 NOTE — Patient Instructions (Addendum)
 Thank you for coming to the office today.  For Neuropathy  Alpha Lipoic Acid natural supplement for nerve health - average 600mg  per dose up to 3 times day  START anti inflammatory topical - OTC Voltaren (generic Diclofenac) topical 2-4 times a day as needed for pain swelling of affected joint for 1-2 weeks or longer.  Nervive cream and supplement are options as well  Keep taking Vitamin B12  Alcohol could also contribute to neuropathy   Please schedule a Follow-up Appointment to: Return if symptoms worsen or fail to improve.  If you have any other questions or concerns, please feel free to call the office or send a message through MyChart. You may also schedule an earlier appointment if necessary.  Additionally, you may be receiving a survey about your experience at our office within a few days to 1 week by e-mail or mail. We value your feedback.  Domingo Friend, DO [facility], [place]

## 2023-10-25 DIAGNOSIS — M62830 Muscle spasm of back: Secondary | ICD-10-CM | POA: Diagnosis not present

## 2023-10-25 DIAGNOSIS — M545 Low back pain, unspecified: Secondary | ICD-10-CM | POA: Diagnosis not present

## 2023-10-25 DIAGNOSIS — M9905 Segmental and somatic dysfunction of pelvic region: Secondary | ICD-10-CM | POA: Diagnosis not present

## 2023-10-25 DIAGNOSIS — M9903 Segmental and somatic dysfunction of lumbar region: Secondary | ICD-10-CM | POA: Diagnosis not present

## 2023-12-03 IMAGING — DX DG KNEE COMPLETE 4+V*R*
4 series · 4 of 4 positions shown · non-contrast
Comparison: None.

CLINICAL DATA: Pain

EXAM:
RIGHT KNEE - COMPLETE 4+ VIEW

[knee ap]
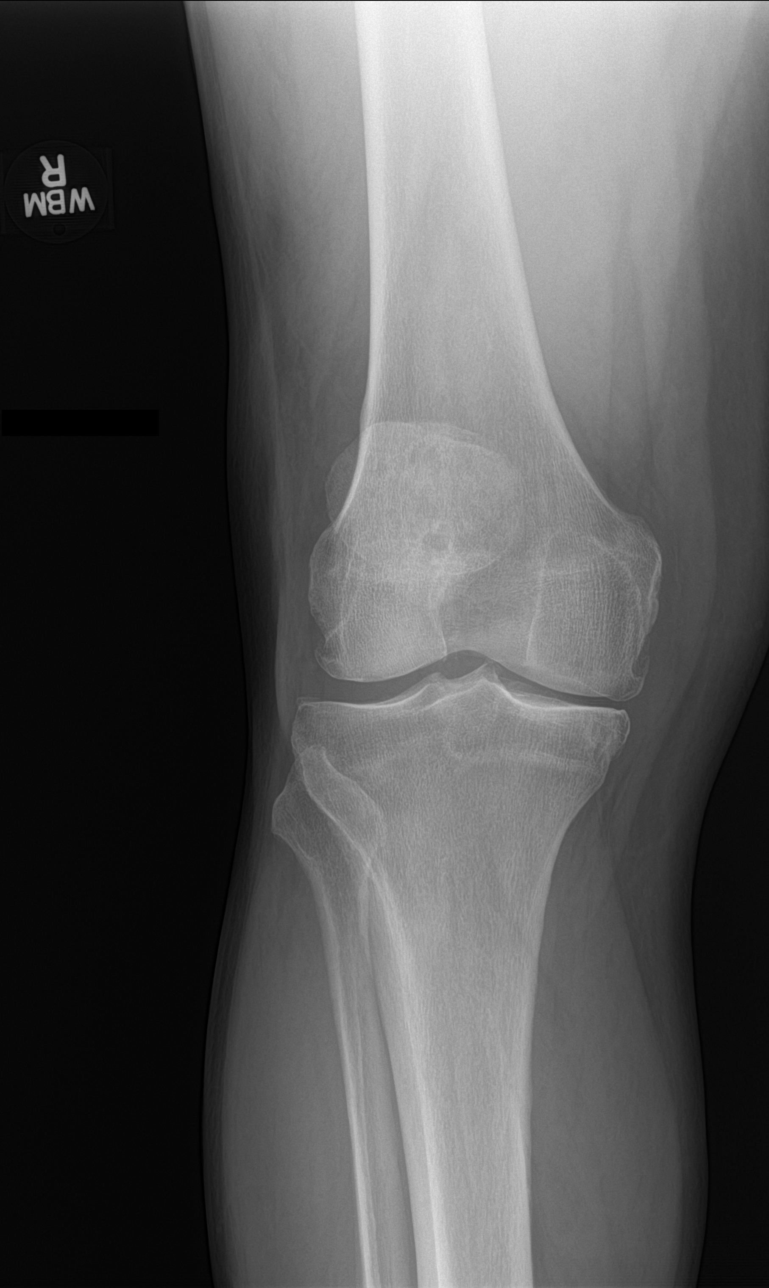

[knee lat]
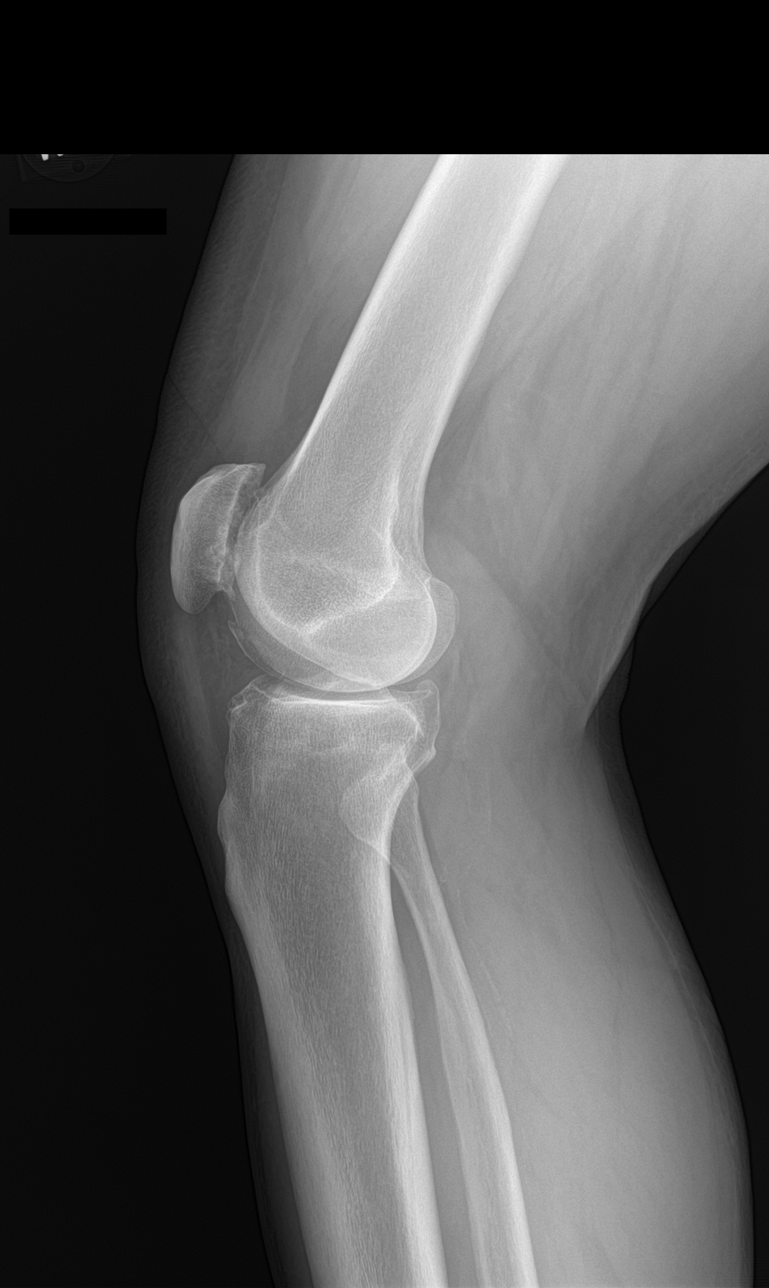

[knee tunnel]
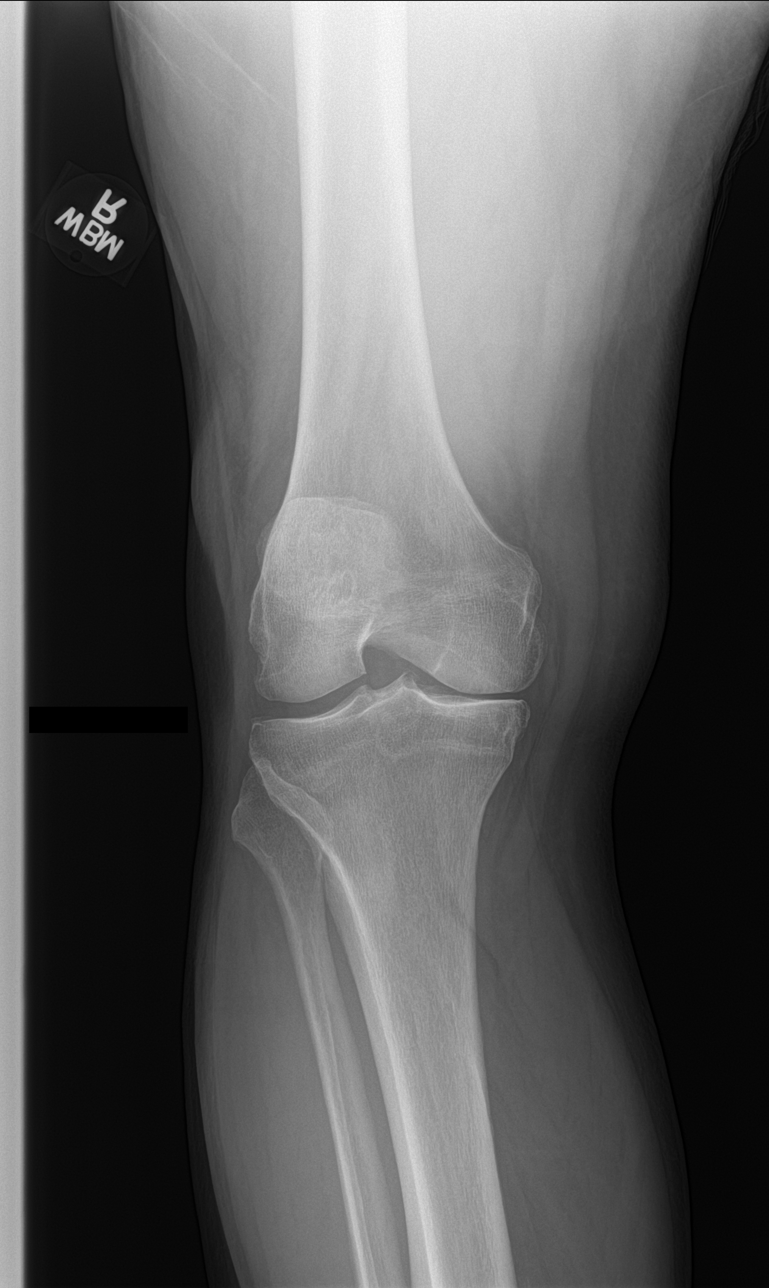

[patella skyline]
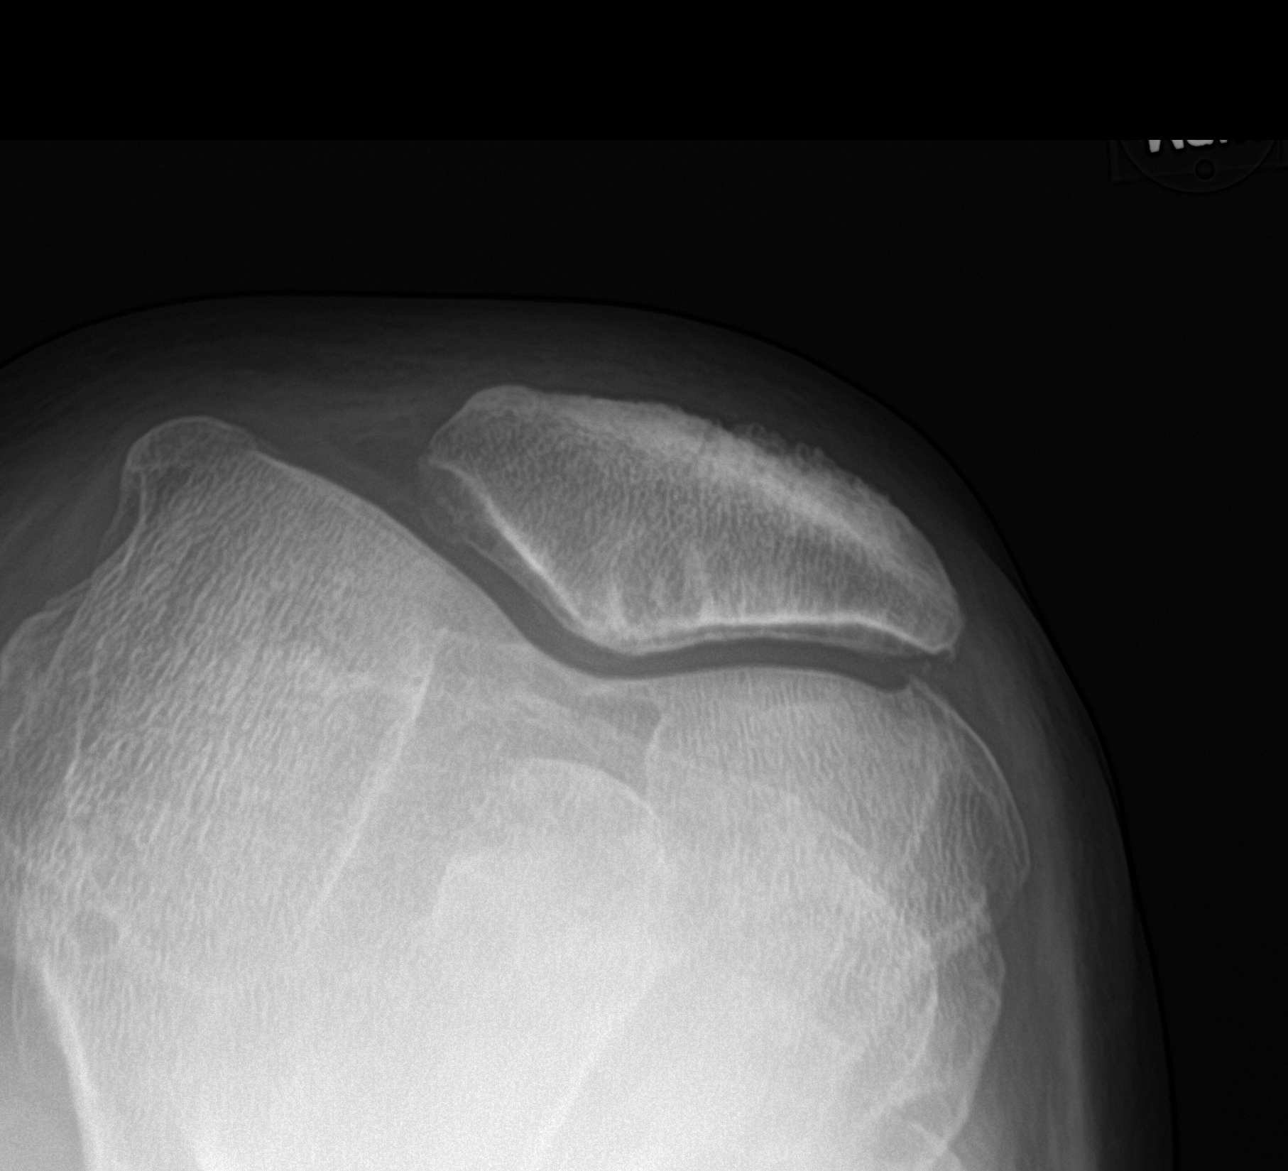

[4 of 4 positions shown; findings below may reference images not displayed]

FINDINGS: No fracture or dislocation is seen. Degenerative changes are noted
with bony spurs in medial, lateral and patellofemoral compartments.
There is narrowing of joint space in the medial compartment. There
is no significant effusion.
IMPRESSION: No fracture or dislocation is seen in the right knee. Degenerative
changes are noted, more so in the medial compartment.

## 2024-03-22 ENCOUNTER — Telehealth: Payer: Self-pay

## 2024-03-22 DIAGNOSIS — Z9189 Other specified personal risk factors, not elsewhere classified: Secondary | ICD-10-CM

## 2024-03-22 MED ORDER — COMIRNATY 30 MCG/0.3ML IM SUSY: 0.3000 mL | PREFILLED_SYRINGE | Freq: Once | INTRAMUSCULAR | 0 refills | Status: AC

## 2024-03-22 NOTE — Telephone Encounter (Signed)
 Patient is requesting a rx to be sent to [facility] in [place] for Covid vaccine.

## 2024-03-22 NOTE — Telephone Encounter (Signed)
 Please notify patient - Rx COVID Vaccine [facility] to [facility] [place]. He should next follow back up with the pharmacy to get the vaccine  Marsa Officer, DO [facility] 03/22/2024, 11:49 AM

## 2024-03-22 NOTE — Telephone Encounter (Signed)
Patient notified prescription sent in.

## 2024-04-03 LAB — FECAL OCCULT BLOOD, IMMUNOCHEMICAL: IFOBT: NEGATIVE

## 2024-04-05 NOTE — Progress Notes (Signed)
 Cody Armstrong                                          MRN: [id number]   04/05/2024   The VBCI Quality Team Specialist reviewed this patient medical record for the purposes of chart review for care gap closure. The following were reviewed: chart review for care gap closure-colorectal cancer screening.    VBCI Quality Team

## 2024-04-24 DIAGNOSIS — M9905 Segmental and somatic dysfunction of pelvic region: Secondary | ICD-10-CM | POA: Diagnosis not present

## 2024-04-24 DIAGNOSIS — M62830 Muscle spasm of back: Secondary | ICD-10-CM | POA: Diagnosis not present

## 2024-04-24 DIAGNOSIS — M545 Low back pain, unspecified: Secondary | ICD-10-CM | POA: Diagnosis not present

## 2024-04-24 DIAGNOSIS — M9903 Segmental and somatic dysfunction of lumbar region: Secondary | ICD-10-CM | POA: Diagnosis not present

## 2024-04-27 DIAGNOSIS — M9903 Segmental and somatic dysfunction of lumbar region: Secondary | ICD-10-CM | POA: Diagnosis not present

## 2024-04-27 DIAGNOSIS — M545 Low back pain, unspecified: Secondary | ICD-10-CM | POA: Diagnosis not present

## 2024-04-27 DIAGNOSIS — M62830 Muscle spasm of back: Secondary | ICD-10-CM | POA: Diagnosis not present

## 2024-04-27 DIAGNOSIS — M9905 Segmental and somatic dysfunction of pelvic region: Secondary | ICD-10-CM | POA: Diagnosis not present

## 2024-04-30 DIAGNOSIS — M62830 Muscle spasm of back: Secondary | ICD-10-CM | POA: Diagnosis not present

## 2024-04-30 DIAGNOSIS — M9905 Segmental and somatic dysfunction of pelvic region: Secondary | ICD-10-CM | POA: Diagnosis not present

## 2024-04-30 DIAGNOSIS — M9903 Segmental and somatic dysfunction of lumbar region: Secondary | ICD-10-CM | POA: Diagnosis not present

## 2024-04-30 DIAGNOSIS — M545 Low back pain, unspecified: Secondary | ICD-10-CM | POA: Diagnosis not present

## 2024-07-03 ENCOUNTER — Telehealth: Payer: Self-pay | Admitting: Family Medicine

## 2024-07-03 NOTE — Telephone Encounter (Signed)
 Spoke with patient, he has a lab appointment in January to have physical labs completed. He just wanted to make sure we check his PSA.

## 2024-07-03 NOTE — Telephone Encounter (Signed)
 Pt is requesting to have PSA on his lab appt .

## 2024-07-04 ENCOUNTER — Other Ambulatory Visit: Payer: Self-pay | Admitting: Family Medicine

## 2024-07-04 DIAGNOSIS — I10 Essential (primary) hypertension: Secondary | ICD-10-CM

## 2024-07-06 NOTE — Telephone Encounter (Signed)
 Requested Prescriptions  Pending Prescriptions Disp Refills   amLODipine  (NORVASC ) 10 MG tablet [Pharmacy Med Name: AMLODIPINE  BESYLATE 10MG TABLETS] 90 tablet 0    Sig: TAKE 1 TABLET(10 MG) BY MOUTH DAILY     Cardiovascular: Calcium Channel Blockers 2 Failed - 07/06/2024 12:31 PM      Failed - Valid encounter within last 6 months    Recent Outpatient Visits           8 months ago Neuropathy of both feet   [facility], Marsa PARAS, DO       Future Appointments             In 3 weeks Edman, Marsa PARAS, DO [place] [facility]            Passed - Last BP in normal range    BP Readings from Last 1 Encounters:  10/24/23 130/76         Passed - Last Heart Rate in normal range    Pulse Readings from Last 1 Encounters:  10/24/23 70

## 2024-07-19 ENCOUNTER — Other Ambulatory Visit: Payer: Self-pay | Admitting: Family Medicine

## 2024-07-19 DIAGNOSIS — Z0000 Encounter for general adult medical examination without abnormal findings: Secondary | ICD-10-CM

## 2024-07-19 DIAGNOSIS — N401 Enlarged prostate with lower urinary tract symptoms: Secondary | ICD-10-CM

## 2024-07-19 DIAGNOSIS — I10 Essential (primary) hypertension: Secondary | ICD-10-CM

## 2024-07-19 DIAGNOSIS — R7309 Other abnormal glucose: Secondary | ICD-10-CM

## 2024-07-19 DIAGNOSIS — E782 Mixed hyperlipidemia: Secondary | ICD-10-CM

## 2024-07-20 ENCOUNTER — Other Ambulatory Visit: Payer: Self-pay

## 2024-07-21 LAB — COMPREHENSIVE METABOLIC PANEL WITH GFR
AG Ratio: 2.2 (calc) (ref 1.0–2.5)
ALT: 18 U/L (ref 9–46)
AST: 15 U/L (ref 10–35)
Albumin: 4.9 g/dL (ref 3.6–5.1)
Alkaline phosphatase (APISO): 62 U/L (ref 35–144)
BUN/Creatinine Ratio: 30 (calc) — ABNORMAL HIGH (ref 6–22)
BUN: 21 mg/dL (ref 7–25)
CO2: 26 mmol/L (ref 20–32)
Calcium: 9.3 mg/dL (ref 8.6–10.3)
Chloride: 106 mmol/L (ref 98–110)
Creat: 0.69 mg/dL — ABNORMAL LOW (ref 0.70–1.28)
Globulin: 2.2 g/dL (ref 1.9–3.7)
Glucose, Bld: 111 mg/dL — ABNORMAL HIGH (ref 65–99)
Potassium: 4.6 mmol/L (ref 3.5–5.3)
Sodium: 141 mmol/L (ref 135–146)
Total Bilirubin: 0.5 mg/dL (ref 0.2–1.2)
Total Protein: 7.1 g/dL (ref 6.1–8.1)
eGFR: 98 mL/min/1.73m2

## 2024-07-21 LAB — CBC WITH DIFFERENTIAL/PLATELET
Absolute Lymphocytes: 1906 {cells}/uL (ref 850–3900)
Absolute Monocytes: 431 {cells}/uL (ref 200–950)
Basophils Absolute: 30 {cells}/uL (ref 0–200)
Basophils Relative: 0.5 %
Eosinophils Absolute: 183 {cells}/uL (ref 15–500)
Eosinophils Relative: 3.1 %
HCT: 47.1 % (ref 39.4–51.1)
Hemoglobin: 15.6 g/dL (ref 13.2–17.1)
MCH: 30.1 pg (ref 27.0–33.0)
MCHC: 33.1 g/dL (ref 31.6–35.4)
MCV: 90.8 fL (ref 81.4–101.7)
MPV: 9.6 fL (ref 7.5–12.5)
Monocytes Relative: 7.3 %
Neutro Abs: 3351 {cells}/uL (ref 1500–7800)
Neutrophils Relative %: 56.8 %
Platelets: 236 Thousand/uL (ref 140–400)
RBC: 5.19 Million/uL (ref 4.20–5.80)
RDW: 13.1 % (ref 11.0–15.0)
Total Lymphocyte: 32.3 %
WBC: 5.9 Thousand/uL (ref 3.8–10.8)

## 2024-07-21 LAB — LIPID PANEL
Cholesterol: 208 mg/dL — ABNORMAL HIGH
HDL: 50 mg/dL
LDL Cholesterol (Calc): 127 mg/dL — ABNORMAL HIGH
Non-HDL Cholesterol (Calc): 158 mg/dL — ABNORMAL HIGH
Total CHOL/HDL Ratio: 4.2 (calc)
Triglycerides: 185 mg/dL — ABNORMAL HIGH

## 2024-07-21 LAB — PSA: PSA: 0.78 ng/mL

## 2024-07-21 LAB — TSH: TSH: 1.3 m[IU]/L (ref 0.40–4.50)

## 2024-07-21 LAB — HEMOGLOBIN A1C
Hgb A1c MFr Bld: 5.3 %
Mean Plasma Glucose: 105 mg/dL
eAG (mmol/L): 5.8 mmol/L

## 2024-07-27 ENCOUNTER — Encounter: Payer: Self-pay | Admitting: Family Medicine

## 2024-07-27 ENCOUNTER — Ambulatory Visit (INDEPENDENT_AMBULATORY_CARE_PROVIDER_SITE_OTHER): Payer: Self-pay | Admitting: Family Medicine

## 2024-07-27 VITALS — BP 132/74 | HR 73 | Ht 67.0 in | Wt 227.4 lb

## 2024-07-27 DIAGNOSIS — E782 Mixed hyperlipidemia: Secondary | ICD-10-CM

## 2024-07-27 DIAGNOSIS — M170 Bilateral primary osteoarthritis of knee: Secondary | ICD-10-CM | POA: Diagnosis not present

## 2024-07-27 DIAGNOSIS — I10 Essential (primary) hypertension: Secondary | ICD-10-CM | POA: Diagnosis not present

## 2024-07-27 DIAGNOSIS — G5793 Unspecified mononeuropathy of bilateral lower limbs: Secondary | ICD-10-CM

## 2024-07-27 DIAGNOSIS — N401 Enlarged prostate with lower urinary tract symptoms: Secondary | ICD-10-CM | POA: Diagnosis not present

## 2024-07-27 DIAGNOSIS — Z0000 Encounter for general adult medical examination without abnormal findings: Secondary | ICD-10-CM | POA: Diagnosis not present

## 2024-07-27 DIAGNOSIS — R351 Nocturia: Secondary | ICD-10-CM

## 2024-07-27 MED ORDER — AMLODIPINE BESYLATE 10 MG PO TABS: 10.0000 mg | ORAL_TABLET | Freq: Every day | ORAL | 3 refills | Status: AC

## 2024-07-27 MED ORDER — LOSARTAN POTASSIUM 100 MG PO TABS: 100.0000 mg | ORAL_TABLET | Freq: Every day | ORAL | 3 refills | Status: AC

## 2024-07-27 NOTE — Patient Instructions (Addendum)
 Thank you for coming to the office today.  Okay to use Tylenol, Aspercreme, Voltaren  Okay to use Naproxen 1-2 pills up to twice day for pain relief. May limit use to as needed going forward.  Labs look great overall.  Recent Labs    07/20/24 0822  HGBA1C 5.3   We can offer Referral to Radiation Oncology for a LOW DOSE RADIATION TREATMENT for arthritis  Refills sent.  Please schedule a Follow-up Appointment to: Return for 1 year fasting lab > 1 week later Annual Physical.  If you have any other questions or concerns, please feel free to call the office or send a message through MyChart. You may also schedule an earlier appointment if necessary.  Additionally, you may be receiving a survey about your experience at our office within a few days to 1 week by e-mail or mail. We value your feedback.  Marsa Officer, DO [facility], [place]

## 2024-07-27 NOTE — Progress Notes (Signed)
 "  Subjective:    Patient ID: Cody Armstrong, male    DOB: 1951/03/27, 74 y.o.   MRN: [id number]  Cody Armstrong is a 74 y.o. male presenting on 07/27/2024 for Annual Exam   HPI  Discussed the use of AI scribe software for clinical note transcription with the patient, who gave verbal consent to proceed.  History of Present Illness   Cody Armstrong is a 74 year old male who presents for an annual physical exam.  Peripheral neuropathy - Worsening neuropathy primarily affecting the feet - Symptoms more pronounced at the end of the day - Occasional burning sensations in the feet - Uses a handheld vibrator for nerve stimulation - Takes alpha lipoic acid supplements twice daily  Osteoarthritis Arthralgia and joint stiffness - Worsening arthritis, particularly in the shoulders - Uses a heating pad in the morning for symptom relief - Takes naproxen 440 mg as needed, which is effective - Uses Aspercreme for additional relief  Hyperlipidemia and dietary modification - History of elevated cholesterol - Current LDL is 127 mg/dL - Has made dietary changes including cutting out sugars and switching to low-carb bread and tortillas - Believes these changes have improved triglycerides and A1c levels - Weight loss from 236 lbs to 227 lbs over the past year  Dietary habits and supplement use - Diet high in vegetables and fish - Limited intake of potatoes and bread - Avoids sugary cereals - Prefers eggs and low-carb Greek yogurt - Occasional alcohol consumption, primarily whiskey - Takes a probiotic daily  Hypertension Controlled on current therapy - Recent blood pressure reading of 138/74 mmHg        Health Maintenance:   Cardiovascular screening - reports 5-7 years ago had slightly abnormal EKG, and sent to Cardiology, had ECHO that was entirely normal Not interested in Coronary Calcium score CT at this time.   Shingrix , Prevnar-20 updated   Last colonoscopy, around 2014. Request  record. Again - offered cologuard. He declines today.   Prostate CA Screening: Last PSA 0.78, negative. Currently asymptomatic. No known family history of prostate CA.      07/27/2024    9:52 AM 10/24/2023    9:38 AM 07/15/2023    8:57 AM  Depression screen PHQ 2/9  Decreased Interest 0 0 0  Down, Depressed, Hopeless 0 0 0  PHQ - 2 Score 0 0 0  Altered sleeping 0 0   Tired, decreased energy 0 0   Change in appetite  0   Feeling bad or failure about yourself  0 0   Trouble concentrating 0 0   Moving slowly or fidgety/restless 0 0   Suicidal thoughts 0 0   PHQ-9 Score 0 0    Difficult doing work/chores Not difficult at all       Data saved with a previous flowsheet row definition       07/27/2024    9:52 AM 10/24/2023    9:38 AM 07/15/2023    8:57 AM 07/08/2022    9:00 AM  GAD 7 : Generalized Anxiety Score  Nervous, Anxious, on Edge 0 0 0 0  Control/stop worrying 0 0 0 0  Worry too much - different things 0 0 0 0  Trouble relaxing 0 0 0 0  Restless 0 0 0 0  Easily annoyed or irritable 0 0 0 0  Afraid - awful might happen 0 0 0 0  Total GAD 7 Score 0 0 0 0  Anxiety Difficulty Not difficult at all  Not difficult at all     Past Medical History:  Diagnosis Date   Allergy    Hypertension    Past Surgical History:  Procedure Laterality Date   CHOLECYSTECTOMY  05/1987   Social History   Socioeconomic History   Marital status: Married    Spouse name: Not on file   Number of children: Not on file   Years of education: High School   Highest education level: 12th grade  Occupational History   Not on file  Tobacco Use   Smoking status: Former    Current packs/day: 1.00    Average packs/day: 1 pack/day for 1 year (1.0 ttl pk-yrs)    Types: Cigarettes   Smokeless tobacco: Never   Tobacco comments:    only as a teen   Vaping Use   Vaping status: Never Used  Substance and Sexual Activity   Alcohol use: Yes    Alcohol/week: 3.0 standard drinks of alcohol    Types:  3 Standard drinks or equivalent per week   Drug use: Never   Sexual activity: Not on file  Other Topics Concern   Not on file  Social History Narrative   Not on file   Social Drivers of Health   Tobacco Use: Medium Risk (07/27/2024)   Patient History    Smoking Tobacco Use: Former    Smokeless Tobacco Use: Never    Passive Exposure: Not on file  Financial Resource Strain: Low Risk (07/23/2024)   Overall Financial Resource Strain (CARDIA)    Difficulty of Paying Living Expenses: Not hard at all  Food Insecurity: No Food Insecurity (07/23/2024)   Epic    Worried About Programme Researcher, Broadcasting/film/video in the Last Year: Never true    Ran Out of Food in the Last Year: Never true  Transportation Needs: No Transportation Needs (07/23/2024)   Epic    Lack of Transportation (Medical): No    Lack of Transportation (Non-Medical): No  Physical Activity: Insufficiently Active (07/23/2024)   Exercise Vital Sign    Days of Exercise per Week: 4 days    Minutes of Exercise per Session: 30 min  Stress: No Stress Concern Present (07/23/2024)   [employer] of Occupational Health - Occupational Stress Questionnaire    Feeling of Stress: Only a little  Social Connections: Socially Integrated (07/23/2024)   Social Connection and Isolation Panel    Frequency of Communication with Friends and Family: More than three times a week    Frequency of Social Gatherings with Friends and Family: More than three times a week    Attends Religious Services: More than 4 times per year    Active Member of Clubs or Organizations: Yes    Attends Banker Meetings: More than 4 times per year    Marital Status: Married  Catering Manager Violence: Not on file  Depression (PHQ2-9): Low Risk (07/27/2024)   Depression (PHQ2-9)    PHQ-2 Score: 0  Alcohol Screen: Low Risk (07/23/2024)   Alcohol Screen    Last Alcohol Screening Score (AUDIT): 3  Housing: Low Risk (07/23/2024)   Epic    Unable to Pay for Housing in the  Last Year: No    Number of Times Moved in the Last Year: 0    Homeless in the Last Year: No  Utilities: Not on file  Health Literacy: Not on file   Family History  Problem Relation Age of Onset   Heart disease Mother    Diabetes Mother  Stroke Mother    Stroke Father    Current Outpatient Medications on File Prior to Visit  Medication Sig   Alpha-Lipoic Acid 600 MG TABS Take 600 mg by mouth 2 (two) times daily.   naproxen sodium (ALEVE) 220 MG tablet Take 220 mg by mouth 2 (two) times daily as needed.   Omega-3 Fatty Acids (FISH OIL) 1000 MG CAPS Take by mouth.   vitamin B-12 (CYANOCOBALAMIN) 100 MCG tablet Take 100 mcg by mouth daily.   trimethoprim -polymyxin b  (POLYTRIM ) ophthalmic solution Place 1 drop into both eyes every 6 (six) hours. Up to 4 drops per day.   No current facility-administered medications on file prior to visit.    Review of Systems  Constitutional:  Negative for activity change, appetite change, chills, diaphoresis, fatigue and fever.  HENT:  Negative for congestion and hearing loss.   Eyes:  Negative for visual disturbance.  Respiratory:  Negative for cough, chest tightness, shortness of breath and wheezing.   Cardiovascular:  Negative for chest pain, palpitations and leg swelling.  Gastrointestinal:  Negative for abdominal pain, constipation, diarrhea, nausea and vomiting.  Genitourinary:  Negative for dysuria, frequency and hematuria.  Musculoskeletal:  Positive for arthralgias. Negative for neck pain.  Skin:  Negative for rash.  Neurological:  Negative for dizziness, weakness, light-headedness, numbness and headaches.  Hematological:  Negative for adenopathy.  Psychiatric/Behavioral:  Negative for behavioral problems, dysphoric mood and sleep disturbance.    Per HPI unless specifically indicated above     Objective:    BP 132/74 (BP Location: Left Arm, Cuff Size: Normal)   Pulse 73   Ht 5' 7 (1.702 m)   Wt 227 lb 6.4 oz (103.1 kg)   SpO2 96%    BMI 35.62 kg/m   Wt Readings from Last 3 Encounters:  07/27/24 227 lb 6.4 oz (103.1 kg)  10/24/23 231 lb (104.8 kg)  07/15/23 236 lb (107 kg)    Physical Exam Vitals and nursing note reviewed.  Constitutional:      General: He is not in acute distress.    Appearance: He is well-developed. He is not diaphoretic.     Comments: Well-appearing, comfortable, cooperative  HENT:     Head: Normocephalic and atraumatic.  Eyes:     General:        Right eye: No discharge.        Left eye: No discharge.     Conjunctiva/sclera: Conjunctivae normal.     Pupils: Pupils are equal, round, and reactive to light.  Neck:     Thyroid : No thyromegaly.     Vascular: No carotid bruit.  Cardiovascular:     Rate and Rhythm: Normal rate and regular rhythm.     Pulses: Normal pulses.     Heart sounds: Normal heart sounds. No murmur heard. Pulmonary:     Effort: Pulmonary effort is normal. No respiratory distress.     Breath sounds: Normal breath sounds. No wheezing or rales.  Abdominal:     General: Bowel sounds are normal. There is no distension.     Palpations: Abdomen is soft. There is no mass.     Tenderness: There is no abdominal tenderness.  Musculoskeletal:        General: No tenderness. Normal range of motion.     Cervical back: Normal range of motion and neck supple.     Right lower leg: No edema.     Left lower leg: No edema.     Comments: Upper / Lower Extremities: -  Normal muscle tone, strength bilateral upper extremities 5/5, lower extremities 5/5  Lymphadenopathy:     Cervical: No cervical adenopathy.  Skin:    General: Skin is warm and dry.     Findings: No erythema or rash.  Neurological:     Mental Status: He is alert and oriented to person, place, and time.     Comments: Distal sensation intact to light touch all extremities  Psychiatric:        Mood and Affect: Mood normal.        Behavior: Behavior normal.        Thought Content: Thought content normal.     Comments:  Well groomed, good eye contact, normal speech and thoughts     Results for orders placed or performed in visit on 07/19/24  Lipid panel   Collection Time: 07/20/24  8:22 AM  Result Value Ref Range   Cholesterol 208 (H) <200 mg/dL   HDL 50 > OR = 40 mg/dL   Triglycerides 814 (H) <150 mg/dL   LDL Cholesterol (Calc) 127 (H) mg/dL (calc)   Total CHOL/HDL Ratio 4.2 <5.0 (calc)   Non-HDL Cholesterol (Calc) 158 (H) <130 mg/dL (calc)  Hemoglobin J8r   Collection Time: 07/20/24  8:22 AM  Result Value Ref Range   Hgb A1c MFr Bld 5.3 <5.7 %   Mean Plasma Glucose 105 mg/dL   eAG (mmol/L) 5.8 mmol/L  CBC with Differential/Platelet   Collection Time: 07/20/24  8:22 AM  Result Value Ref Range   WBC 5.9 3.8 - 10.8 Thousand/uL   RBC 5.19 4.20 - 5.80 Million/uL   Hemoglobin 15.6 13.2 - 17.1 g/dL   HCT 52.8 60.5 - 48.8 %   MCV 90.8 81.4 - 101.7 fL   MCH 30.1 27.0 - 33.0 pg   MCHC 33.1 31.6 - 35.4 g/dL   RDW 86.8 88.9 - 84.9 %   Platelets 236 140 - 400 Thousand/uL   MPV 9.6 7.5 - 12.5 fL   Neutro Abs 3,351 1,500 - 7,800 cells/uL   Absolute Lymphocytes 1,906 850 - 3,900 cells/uL   Absolute Monocytes 431 200 - 950 cells/uL   Eosinophils Absolute 183 15 - 500 cells/uL   Basophils Absolute 30 0 - 200 cells/uL   Neutrophils Relative % 56.8 %   Total Lymphocyte 32.3 %   Monocytes Relative 7.3 %   Eosinophils Relative 3.1 %   Basophils Relative 0.5 %  PSA   Collection Time: 07/20/24  8:22 AM  Result Value Ref Range   PSA 0.78 < OR = 4.00 ng/mL  TSH   Collection Time: 07/20/24  8:22 AM  Result Value Ref Range   TSH 1.30 0.40 - 4.50 mIU/L  Comprehensive metabolic panel with GFR   Collection Time: 07/20/24  8:22 AM  Result Value Ref Range   Glucose, Bld 111 (H) 65 - 99 mg/dL   BUN 21 7 - 25 mg/dL   Creat 9.30 (L) 9.29 - 1.28 mg/dL   eGFR 98 > OR = 60 fO/fpw/8.26f7   BUN/Creatinine Ratio 30 (H) 6 - 22 (calc)   Sodium 141 135 - 146 mmol/L   Potassium 4.6 3.5 - 5.3 mmol/L   Chloride 106  98 - 110 mmol/L   CO2 26 20 - 32 mmol/L   Calcium 9.3 8.6 - 10.3 mg/dL   Total Protein 7.1 6.1 - 8.1 g/dL   Albumin 4.9 3.6 - 5.1 g/dL   Globulin 2.2 1.9 - 3.7 g/dL (calc)   AG Ratio 2.2 1.0 -  2.5 (calc)   Total Bilirubin 0.5 0.2 - 1.2 mg/dL   Alkaline phosphatase (APISO) 62 35 - 144 U/L   AST 15 10 - 35 U/L   ALT 18 9 - 46 U/L      Assessment & Plan:   Problem List Items Addressed This Visit     Essential hypertension   Relevant Medications   amLODipine  (NORVASC ) 10 MG tablet   losartan  (COZAAR ) 100 MG tablet   Mixed hyperlipidemia   Relevant Medications   amLODipine  (NORVASC ) 10 MG tablet   losartan  (COZAAR ) 100 MG tablet   Morbid obesity (HCC)   Primary osteoarthritis of both knees   Relevant Medications   naproxen sodium (ALEVE) 220 MG tablet   Other Visit Diagnoses       Annual physical exam    -  Primary     Benign prostatic hyperplasia with nocturia         Neuropathy of both feet            Updated Health Maintenance information Reviewed recent lab results with patient Encouraged improvement to lifestyle with diet and exercise Goal of weight loss  Morbid Obesity BMI >35 With comorbid conditions hypertension osteoarthritis  Essential hypertension Blood pressure controlled with current regimen. Recent reading 138/74 mmHg - Continue current antihypertensive medication regimen.  Osteoarthritis, multiple sites Symptoms worsening in shoulders and wrists. Discussed low-dose radiation therapy for pain, but he prefers to reassess in a year. - Continue naproxen as needed for pain management. - Use topical treatments such as Aspercreme for additional pain relief. - Consider low-dose radiation therapy in the future. Referral if interested  Peripheral neuropathy Symptoms worsening by end of day. Discussed gabapentin , but he prefers current management. - Continue alpha-lipoic acid supplementation. - Continue nerve stimulation techniques for symptom relief. -  Consider gabapentin  if symptoms worsen.  Hyperlipidemia LDL slightly elevated at 127 mg/dL. Triglycerides improved with dietary changes. - Continue current dietary modifications to manage cholesterol levels. Offer coronary calcium score, declined at this time  General Health Maintenance Vaccinations up to date. Weight decreased by 10 pounds. Dietary changes effective. - Maintain current dietary and lifestyle modifications.        No orders of the defined types were placed in this encounter.   Meds ordered this encounter  Medications   amLODipine  (NORVASC ) 10 MG tablet    Sig: Take 1 tablet (10 mg total) by mouth daily.    Dispense:  90 tablet    Refill:  3    Add future refills for 1 year   losartan  (COZAAR ) 100 MG tablet    Sig: Take 1 tablet (100 mg total) by mouth daily.    Dispense:  90 tablet    Refill:  3    Add future refills for 1 year     Follow up plan: Return for 1 year fasting lab > 1 week later Annual Physical.  Future labs 07/28/25  Marsa Officer, DO [facility] 07/27/2024, 8:10 AM  "

## 2024-07-28 ENCOUNTER — Other Ambulatory Visit: Payer: Self-pay | Admitting: Family Medicine

## 2024-07-28 DIAGNOSIS — I10 Essential (primary) hypertension: Secondary | ICD-10-CM

## 2024-07-28 DIAGNOSIS — Z0000 Encounter for general adult medical examination without abnormal findings: Secondary | ICD-10-CM

## 2024-07-28 DIAGNOSIS — E782 Mixed hyperlipidemia: Secondary | ICD-10-CM

## 2024-07-28 DIAGNOSIS — R7309 Other abnormal glucose: Secondary | ICD-10-CM

## 2024-07-28 DIAGNOSIS — N401 Enlarged prostate with lower urinary tract symptoms: Secondary | ICD-10-CM

## 2025-07-26 ENCOUNTER — Other Ambulatory Visit

## 2025-07-26 ENCOUNTER — Ambulatory Visit: Admitting: Family Medicine

## 2025-08-02 ENCOUNTER — Encounter: Admitting: Family Medicine

## 2025-08-02 ENCOUNTER — Ambulatory Visit: Admitting: Family Medicine
